# Patient Record
Sex: Male | Born: 1950 | Race: White | Hispanic: No | Smoking: Former smoker
Health system: Southern US, Community
[De-identification: ages and names within clinical notes are randomized; demographics above are authoritative.]

## PROBLEM LIST (undated history)

## (undated) DIAGNOSIS — F329 Major depressive disorder, single episode, unspecified: Secondary | ICD-10-CM

## (undated) DIAGNOSIS — I509 Heart failure, unspecified: Secondary | ICD-10-CM

## (undated) DIAGNOSIS — E213 Hyperparathyroidism, unspecified: Secondary | ICD-10-CM

## (undated) DIAGNOSIS — D649 Anemia, unspecified: Secondary | ICD-10-CM

## (undated) DIAGNOSIS — M5136 Other intervertebral disc degeneration, lumbar region: Secondary | ICD-10-CM

## (undated) DIAGNOSIS — E46 Unspecified protein-calorie malnutrition: Secondary | ICD-10-CM

## (undated) DIAGNOSIS — N200 Calculus of kidney: Secondary | ICD-10-CM

## (undated) DIAGNOSIS — F32A Depression, unspecified: Secondary | ICD-10-CM

## (undated) DIAGNOSIS — I10 Essential (primary) hypertension: Secondary | ICD-10-CM

## (undated) DIAGNOSIS — G4730 Sleep apnea, unspecified: Secondary | ICD-10-CM

## (undated) DIAGNOSIS — K759 Inflammatory liver disease, unspecified: Secondary | ICD-10-CM

## (undated) DIAGNOSIS — R609 Edema, unspecified: Secondary | ICD-10-CM

## (undated) DIAGNOSIS — E785 Hyperlipidemia, unspecified: Secondary | ICD-10-CM

## (undated) DIAGNOSIS — I639 Cerebral infarction, unspecified: Secondary | ICD-10-CM

## (undated) DIAGNOSIS — E119 Type 2 diabetes mellitus without complications: Secondary | ICD-10-CM

## (undated) DIAGNOSIS — M51369 Other intervertebral disc degeneration, lumbar region without mention of lumbar back pain or lower extremity pain: Secondary | ICD-10-CM

## (undated) HISTORY — PX: INSERTION OF DIALYSIS CATHETER: SHX1324

## (undated) HISTORY — PX: LITHOTRIPSY: SUR834

## (undated) HISTORY — PX: ROTATOR CUFF REPAIR: SHX139

## (undated) HISTORY — DX: Type 2 diabetes mellitus without complications: E11.9

## (undated) HISTORY — PX: COLONOSCOPY: SHX174

## (undated) HISTORY — DX: Hyperlipidemia, unspecified: E78.5

## (undated) HISTORY — DX: Calculus of kidney: N20.0

## (undated) HISTORY — DX: Inflammatory liver disease, unspecified: K75.9

## (undated) HISTORY — DX: Other intervertebral disc degeneration, lumbar region without mention of lumbar back pain or lower extremity pain: M51.369

## (undated) HISTORY — DX: Essential (primary) hypertension: I10

## (undated) HISTORY — DX: Other intervertebral disc degeneration, lumbar region: M51.36

## (undated) HISTORY — PX: TONSILLECTOMY: SUR1361

---

## 1998-08-12 ENCOUNTER — Encounter: Admission: RE | Admit: 1998-08-12 | Discharge: 1998-11-10 | Payer: Self-pay | Admitting: Anesthesiology

## 1998-12-29 ENCOUNTER — Encounter: Payer: Self-pay | Admitting: Neurosurgery

## 1998-12-29 ENCOUNTER — Ambulatory Visit (HOSPITAL_COMMUNITY): Admission: RE | Admit: 1998-12-29 | Discharge: 1998-12-29 | Payer: Self-pay | Admitting: Neurosurgery

## 2005-02-12 ENCOUNTER — Ambulatory Visit: Payer: Self-pay

## 2006-01-03 ENCOUNTER — Emergency Department: Payer: Self-pay | Admitting: Unknown Physician Specialty

## 2006-11-13 ENCOUNTER — Emergency Department: Payer: Self-pay | Admitting: Emergency Medicine

## 2006-12-05 ENCOUNTER — Ambulatory Visit: Payer: Self-pay | Admitting: Specialist

## 2009-03-05 ENCOUNTER — Ambulatory Visit: Payer: Self-pay | Admitting: Family Medicine

## 2009-03-11 ENCOUNTER — Encounter: Payer: Self-pay | Admitting: Cardiology

## 2009-03-18 ENCOUNTER — Ambulatory Visit: Payer: Self-pay | Admitting: Family Medicine

## 2009-03-18 ENCOUNTER — Encounter: Payer: Self-pay | Admitting: Cardiology

## 2009-04-01 ENCOUNTER — Ambulatory Visit: Payer: Self-pay

## 2009-04-03 ENCOUNTER — Encounter: Payer: Self-pay | Admitting: Cardiology

## 2009-04-03 ENCOUNTER — Ambulatory Visit: Payer: Self-pay | Admitting: Family Medicine

## 2009-04-03 ENCOUNTER — Ambulatory Visit: Payer: Self-pay | Admitting: Cardiology

## 2009-04-03 DIAGNOSIS — I309 Acute pericarditis, unspecified: Secondary | ICD-10-CM

## 2009-04-03 DIAGNOSIS — E785 Hyperlipidemia, unspecified: Secondary | ICD-10-CM

## 2009-04-03 DIAGNOSIS — R0602 Shortness of breath: Secondary | ICD-10-CM

## 2009-04-03 DIAGNOSIS — I10 Essential (primary) hypertension: Secondary | ICD-10-CM | POA: Insufficient documentation

## 2009-04-04 LAB — CONVERTED CEMR LAB: Pro B Natriuretic peptide (BNP): 255.7 pg/mL — ABNORMAL HIGH (ref 0.0–100.0)

## 2009-04-11 ENCOUNTER — Ambulatory Visit: Payer: Self-pay | Admitting: Family Medicine

## 2009-04-11 ENCOUNTER — Encounter: Payer: Self-pay | Admitting: Cardiology

## 2009-04-16 ENCOUNTER — Encounter: Payer: Self-pay | Admitting: Cardiology

## 2009-04-16 ENCOUNTER — Ambulatory Visit: Payer: Self-pay | Admitting: Family Medicine

## 2009-04-16 ENCOUNTER — Ambulatory Visit: Payer: Self-pay | Admitting: Internal Medicine

## 2009-04-17 ENCOUNTER — Ambulatory Visit: Payer: Self-pay | Admitting: Cardiology

## 2009-04-22 LAB — CONVERTED CEMR LAB: Sed Rate: 6 mm/hr (ref 0–16)

## 2009-04-24 ENCOUNTER — Ambulatory Visit: Payer: Self-pay | Admitting: Internal Medicine

## 2009-04-24 ENCOUNTER — Encounter: Payer: Self-pay | Admitting: Cardiology

## 2009-04-28 LAB — CONVERTED CEMR LAB
BUN: 18 mg/dL (ref 6–23)
CO2: 24 meq/L (ref 19–32)
Calcium: 10 mg/dL (ref 8.4–10.5)
Chloride: 100 meq/L (ref 96–112)
Creatinine, Ser: 1.04 mg/dL (ref 0.40–1.50)
Glucose, Bld: 189 mg/dL — ABNORMAL HIGH (ref 70–99)
Potassium: 3.9 meq/L (ref 3.5–5.3)
Sodium: 142 meq/L (ref 135–145)

## 2009-06-20 ENCOUNTER — Ambulatory Visit: Payer: Self-pay | Admitting: Cardiology

## 2009-07-08 ENCOUNTER — Telehealth (INDEPENDENT_AMBULATORY_CARE_PROVIDER_SITE_OTHER): Payer: Self-pay | Admitting: *Deleted

## 2010-10-18 LAB — CONVERTED CEMR LAB
BUN: 19 mg/dL (ref 6–23)
CO2: 25 meq/L (ref 19–32)
Calcium: 9.4 mg/dL (ref 8.4–10.5)
Chloride: 94 meq/L — ABNORMAL LOW (ref 96–112)
Creatinine, Ser: 1.03 mg/dL (ref 0.40–1.50)
Glucose, Bld: 177 mg/dL — ABNORMAL HIGH (ref 70–99)
Potassium: 3.2 meq/L — ABNORMAL LOW (ref 3.5–5.3)
Sodium: 140 meq/L (ref 135–145)

## 2011-03-02 ENCOUNTER — Encounter: Payer: Self-pay | Admitting: Cardiovascular Disease

## 2013-03-28 LAB — CBC
MCH: 30.2 pg (ref 26.0–34.0)
Platelet: 319 10*3/uL (ref 150–440)
RDW: 13.1 % (ref 11.5–14.5)
WBC: 11.6 10*3/uL — ABNORMAL HIGH (ref 3.8–10.6)

## 2013-03-28 LAB — COMPREHENSIVE METABOLIC PANEL
Alkaline Phosphatase: 115 U/L (ref 50–136)
Anion Gap: 6 — ABNORMAL LOW (ref 7–16)
BUN: 26 mg/dL — ABNORMAL HIGH (ref 7–18)
Bilirubin,Total: 0.3 mg/dL (ref 0.2–1.0)
Calcium, Total: 9 mg/dL (ref 8.5–10.1)
Creatinine: 1.63 mg/dL — ABNORMAL HIGH (ref 0.60–1.30)
Glucose: 234 mg/dL — ABNORMAL HIGH (ref 65–99)
Potassium: 3.2 mmol/L — ABNORMAL LOW (ref 3.5–5.1)
Sodium: 135 mmol/L — ABNORMAL LOW (ref 136–145)

## 2013-03-28 LAB — TROPONIN I: Troponin-I: <0.02 ng/mL

## 2013-03-29 ENCOUNTER — Inpatient Hospital Stay: Payer: Self-pay | Admitting: Internal Medicine

## 2013-03-29 LAB — APTT: Activated PTT: 34.4 secs (ref 23.6–35.9)

## 2013-03-29 LAB — PROTIME-INR: INR: 1

## 2013-03-29 LAB — URINALYSIS, COMPLETE
Glucose,UR: >=500 mg/dL (ref 0–75)
Specific Gravity: 1.011 (ref 1.003–1.030)

## 2013-03-30 LAB — COMPREHENSIVE METABOLIC PANEL
Anion Gap: 5 — ABNORMAL LOW (ref 7–16)
BUN: 20 mg/dL — ABNORMAL HIGH (ref 7–18)
Bilirubin,Total: 0.4 mg/dL (ref 0.2–1.0)
Calcium, Total: 8.5 mg/dL (ref 8.5–10.1)
EGFR (African American): >60
EGFR (Non-African Amer.): >60
Glucose: 119 mg/dL — ABNORMAL HIGH (ref 65–99)
Osmolality: 283 (ref 275–301)
Potassium: 3.1 mmol/L — ABNORMAL LOW (ref 3.5–5.1)
SGOT(AST): 15 U/L (ref 15–37)
Sodium: 140 mmol/L (ref 136–145)
Total Protein: 6.1 g/dL — ABNORMAL LOW (ref 6.4–8.2)

## 2013-03-30 LAB — CBC WITH DIFFERENTIAL/PLATELET
Basophil %: 0.9 %
Eosinophil #: 0.1 10*3/uL (ref 0.0–0.7)
HCT: 38.2 % — ABNORMAL LOW (ref 40.0–52.0)
Lymphocyte #: 1.7 10*3/uL (ref 1.0–3.6)
MCHC: 35.4 g/dL (ref 32.0–36.0)
Monocyte %: 9.6 %
Neutrophil #: 4.3 10*3/uL (ref 1.4–6.5)
WBC: 6.9 10*3/uL (ref 3.8–10.6)

## 2013-03-30 LAB — HEMOGLOBIN A1C: Hemoglobin A1C: 11.4 % — ABNORMAL HIGH (ref 4.2–6.3)

## 2013-03-30 LAB — LIPID PANEL
Cholesterol: 221 mg/dL — ABNORMAL HIGH (ref 0–200)
Triglycerides: 323 mg/dL — ABNORMAL HIGH (ref 0–200)
VLDL Cholesterol, Calc: 65 mg/dL — ABNORMAL HIGH (ref 5–40)

## 2013-03-30 LAB — TSH: Thyroid Stimulating Horm: 1.4 u[IU]/mL

## 2013-04-03 LAB — PLATELET COUNT: Platelet: 236 10*3/uL (ref 150–440)

## 2013-12-19 ENCOUNTER — Ambulatory Visit: Payer: Self-pay

## 2013-12-19 DIAGNOSIS — R7989 Other specified abnormal findings of blood chemistry: Secondary | ICD-10-CM

## 2014-01-01 ENCOUNTER — Ambulatory Visit: Payer: Self-pay | Admitting: Family Medicine

## 2014-01-17 ENCOUNTER — Ambulatory Visit: Payer: Self-pay | Admitting: Urology

## 2014-01-18 ENCOUNTER — Ambulatory Visit: Payer: Self-pay | Admitting: Family Medicine

## 2014-05-14 ENCOUNTER — Ambulatory Visit: Payer: Self-pay | Admitting: Family

## 2014-06-05 ENCOUNTER — Ambulatory Visit: Payer: Self-pay | Admitting: Family

## 2015-01-10 NOTE — Discharge Summary (Signed)
PATIENT NAME:  Nathan Freeman, Nathan Freeman MR#:  [id number] DATE OF BIRTH:  1951/03/13  DATE OF ADMISSION:  03/29/2013 DATE OF DISCHARGE:  03/30/2013.   Discharge/transfer to inpatient rehab at [facility].   DISCHARGE DIAGNOSES:  1.  Acute cerebral vascular accident of periventricular area on the right with left-sided weakness.  2.  Uncontrolled hypertension:  3.  Uncontrolled diabetes mellitus.  4.  Noncompliance.  5.  Hyperlipidemia.  6.  Acute renal failure over chronic kidney disease, stage III.   CONSULTS:  None.   IMAGING STUDIES DONE:  Include a CT scan of the head without contrast, which showed lacunar infarcts of the basal ganglia area.   MRI of the brain without contrast shows findings consistent with regions of subacute periventricular lacunar infarct on the right, areas of chronic lacunar infarction at the basal ganglia area, chronic small vessel white matter ischemic changes.   Ultrasound carotid Doppler showed no significant stenosis.   A 2-D echocardiogram showed left ventricular ejection fraction of 60% to 65% with impaired relaxation pattern of LV diastolic filling. No thrombus. Intact intra-atrial and intraventricular septa.   ADMITTING HISTORY AND PHYSICAL:  Please see detailed H and P dictated by Dr. Amado CoeGouru on 03/30/2013. In brief, a 64 year old patient with hypertension, diabetes, presented to the hospital complaining of left-sided weakness of past 10 days. The patient was found to have subacute infarct on CT scan, admitted to the hospitalist service.   HOSPITAL COURSE:  The patient was admitted onto a tele floor with neuro checks q.4 hours. The patient had an MRI of the brain which showed subacute stroke on the right side. The carotid ultrasound and echo showed no significant findings. The patient was taking baby aspirin to which Plavix has been added. He has been noncompliant with his hypertension, diabetes and statin medication. He has been started on atorvastatin along with the  blood pressure medications and diabetes medications. The patient has had symptoms for 10 days. His stroke could have happen in the past few days. Blood pressure is slowly being controlled.   The patient was seen by Physical Therapy who recommended inpatient rehab and will be transferred when a bed is available.   On the day of discharge, the patient's motor strength is 5/5 in upper extremities and 4/5 in the left lower extremity.   DISCHARGE MEDICATIONS:  Include  1.  Metformin 1000 mg oral 2 times a day.  2.  Citalopram 40 mg oral once a day.  3.  Fish oil 1200 mg oral once a day.  4.  Lisinopril 20 mg oral once a day. 5.  Glipizide 5 mg oral 2 times a day.  6.  Atorvastatin 20 mg oral once a day.  7.  Aspirin 81 mg oral once a day.  8.  Plavix 75 mg oral once a day.  9.  Coreg 25 mg oral 2 times a day.  10.  Amlodipine 10 mg oral once a day to be started on 04/01/2013 if systolic blood pressure greater than 160.   DISCHARGE INSTRUCTIONS:  A low-sodium, low-fat diet, carbohydrate-controlled. Activity as tolerated with assistance. Follow up in 1 to 2 days after transfer with the rehab physician.   TIME SPENT ON DAY OF DISCHARGE WAS:  45 minutes.  ____________________________ Molinda BailiffSrikar R. Addelynn Batte, MD srs:jm D: 03/30/2013 15:33:42 ET T: 03/30/2013 15:52:51 ET JOB#: [id number]  cc: Wardell HeathSrikar R. Virgel Haro, MD, <Dictator> Orie FishermanSRIKAR R Latina Frank MD ELECTRONICALLY SIGNED 04/08/2013 23:40

## 2015-01-10 NOTE — Discharge Summary (Signed)
PATIENT NAME:  Nathan HartshornWEBSTER, [name] MR#:  [id number] DATE OF BIRTH:  [birth year]  DATE OF ADMISSION:  03/29/2013 DATE OF DISCHARGE:    ADDENDUM  Original interim discharge dictation done by Dr. Elpidio AnisSudini. Original discharge dictation remains the same except:   DISCHARGE MEDICATIONS: Include Celexa 40 mg p.o. daily, fish oil 1200 mg p.o. daily, lisinopril 20 mg p.o. daily, Lipitor 20 mg p.o. daily, aspirin 81 mg daily, Plavix 75 mg p.o. daily, Coreg 25 mg p.o. b.i.d., amlodipine 10 mg p.o. daily, glipizide 10 mg p.o. b.i.d., hydralazine 25 mg p.o. b.i.d., metformin 1 gram p.o. b.i.d.   DIET: Low-sodium, low-fat, ADA diet.   The patient's glipizide has been increased and hydrallazine is  added.    The patient was seen by physical therapy and he was able to walk 350 feet with front-wheeled walker, so he did not qualify for inpatient rehab. Case manager is trying to get home physical therapy, so we are sending him home with home health and Britta MccreedyBarbara from [facility] is going to look at the patient for financial issues, and only if he gets home physical therapy I am going to let him go. Otherwise, he needs to stay for further physical therapy.   TIME SPENT: More than 30 minutes.   ____________________________ Katha HammingSnehalatha Mandela Bello, MD sk:jm D: 04/02/2013 13:58:00 ET T: 04/02/2013 14:29:05 ET JOB#: [id number]  cc: Katha HammingSnehalatha Karrington Studnicka, MD, <Dictator> Katha HammingSNEHALATHA Kensington Rios MD ELECTRONICALLY SIGNED 04/11/2013 22:25

## 2015-01-10 NOTE — Discharge Summary (Signed)
PATIENT NAME:  Nathan HartshornWEBSTER, Nathan Freeman#:  [id number] DATE OF BIRTH:  1950-11-28  DATE OF ADMISSION:  03/29/2013 DATE OF DISCHARGE:    ADDENDUM    For original discharge dictation done by Dr. Elpidio AnisSudini, I did an addendum yesterday, but the patient did not go to home because of not getting physical therapy at home. Today, the case manager is able to get the physical therapy with [facility] to home, and the patient will get home physical therapy and nursing for disease management and diabetes, so he will be going home today on discharge medications as I listed yesterday. He will get physical therapy and has [facility] appointment on July 22 at 6:30 p.m.   ____________________________ Katha HammingSnehalatha Gleen Ripberger, MD sk:jm D: 04/03/2013 13:18:33 ET T: 04/03/2013 13:40:27 ET JOB#: [id number]  cc: Katha HammingSnehalatha Yeny Schmoll, MD, <Dictator> Katha HammingSNEHALATHA Sylvestre Rathgeber MD ELECTRONICALLY SIGNED 04/08/2013 14:14

## 2015-01-10 NOTE — H&P (Signed)
PATIENT NAME:  Nathan Freeman, Nathan Freeman MR#:  [id number] DATE OF BIRTH:  [birth year]  DATE OF ADMISSION:  03/29/2013  REFERRING PHYSICIAN: Andrey FarmerJulie E. Lavella LemonsManly, MD  PRIMARY CARE PHYSICIAN: Nonlocal.   CHIEF COMPLAINT: Left-sided weakness for the past 10 days.   HISTORY OF PRESENT ILLNESS: The patient is a 64 year old pleasant male with a past medical history of diabetes mellitus, hypertension, hyperlipidemia, chronic low back pain, sciatica and osteoarthritis. He is presenting to the ER with progressive worsening of left-sided weakness for the past 10 days. The patient is reporting that he went on a mission trip for church to [place], and while coming back, he drove for approximately 45 hours. He came home on June 29th and fell asleep because he was tired. After he woke up, he started feeling weak on the left lower extremity. Eventually, the left leg gave out, and he fell on 3 separate occasions for 3 times. The first time when he fell, he hit his left side of his forehead and broke his glasses, but he denies any major injuries, but progressively, the left lower extremity weakness extended to the left upper extremity, and he was having difficulty with ambulation. He denies any forgetfulness or speech difficulties. Denies any difficulty in swallowing. As the patient's wife forced him to go to ER, he came into the ER, and a CAT scan of the head has revealed right lacunar infarct. Hospitalist team is called to admit the patient. The patient has chronic history of diabetes mellitus and thinks, because of that, he has chronic kidney problems. He has chronic history of headache, and it is not getting worse. Denies any blurry vision or floaters. Denies any forgetfulness. No other complaints.   PAST MEDICAL HISTORY:  1. Diabetes mellitus.  2. Hypertension.  3. Hyperlipidemia.  4. Osteoarthritis.  5. Chronic low back pain, sciatica.   PAST SURGICAL HISTORY: Left shoulder repair.  ALLERGIES: No known drug allergies.    HOME MEDICATIONS:  1. Vytorin 10/20 one tablet p.o. once daily. 2. Prevacid 40 mg 2 tablets once a day.  3.  Metoprolol 25 mg 2 times a day.  5. Metformin 1000 mg 2 times a day.  6. Lisinopril 10 mg once daily. This needs to be clarified as it says 20 mg as well. 7. Ibuprofen 900 mg 1 tablet 3 times a day.  8. Hydrochlorothiazide 1 tablet once daily. 9. Glipizide 1 tablet 2 times a day. 10. Fish oil 1200 mg once daily.  11. Celexa 1 tablet p.o. once daily. 12. Coreg 25 mg 2 times a day. 13. Benicar 40 mg 1 tablet once a day.  14. Amlodipine 10 mg once daily.  15. Aspirin 81 mg once daily.   PSYCHOSOCIAL HISTORY: Lives at home with wife. Denies any history of smoking, alcohol or illicit drug usage.   FAMILY HISTORY: Both parents had heart attack. Multiple brothers have heart attack and 1 brother had coronary artery bypass grafting.   REVIEW OF SYSTEMS:  CONSTITUTIONAL: Denies any fever, fatigue, weakness.  EYES: Denies any blurry vision. No glaucoma.  ENT: Denies epistaxis, snoring, postnasal drip.  RESPIRATION: Denies any cough, COPD.  CARDIOVASCULAR: No chest pain, palpitations, syncope.  GASTROINTESTINAL: Denies nausea, vomiting, diarrhea.  GENITOURINARY: No dysuria or hematuria. No hernia or prostatitis.  ENDOCRINE: Denies any polyuria, nocturia, thyroid problems.  HEMATOLOGIC AND LYMPHATIC: Denies any anemia, easy bruising or bleeding.  INTEGUMENTARY: No acne, rash, lesions.  MUSCULOSKELETAL: No joint effusion, tenderness or erythema, but has chronic low back pain.  NEUROLOGIC: Complaining  of 10-day history of left-sided weakness. Denies any dysarthria or epilepsy.  PSYCHIATRIC: Denies any ADD, OCD, bipolar disorder.   PHYSICAL EXAMINATION:  VITAL SIGNS: Temperature 98.9, pulse 57, respirations 20, blood pressure is 176/84, pulse oximetry is 97% on room air. GENERAL APPEARANCE: Not under acute distress. Moderately built and moderately nourished.  HEENT: Normocephalic,  atraumatic. Pupils are equally reacting to light and accommodation. No scleral icterus. No conjunctival injection. No sinus tenderness. No postnasal drip. No pharyngeal exudates.  NECK: Supple. No JVD. No thyromegaly. No lymphadenopathy. Range of motion is intact.  LUNGS: Clear to auscultation bilaterally. No accessory muscle usage. No anterior chest wall tenderness on palpation.  CARDIAC: S1, S2 normal. Regular rate and rhythm. No murmurs.  GASTROINTESTINAL: Soft. Bowel sounds are positive in all 4 quadrants. Nontender, nondistended. No masses felt. No hepatosplenomegaly. NEUROLOGIC: Awake, alert and oriented x3. Cranial nerves II through XII are grossly intact. No facial droop. No cerebellar signs. Sensory is intact. Motor: Right upper extremity and lower extremity are intact. Left upper and lower extremity weak, with strength of 3 out of 5. Reflexes are 2+. No Babinski sign.  EXTREMITIES: No edema. No cyanosis. No clubbing.  PSYCHIATRIC: Normal mood and affect.  MUSCULOSKELETAL: No joint effusion, tenderness or erythema.  SKIN: Normal turgor. No rashes. No lesions.   LABORATORY AND IMAGING STUDIES: CAT scan of the head has revealed lacunar infarct in the periventricular white matter in the right parietal lobe. No hemorrhage or cortical infarct. The remainder of the findings are described in the body of the report. A 12-lead EKG: Sinus rhythm with first-degree AV block, nonspecific ST-T wave changes. Glucose 234, BUN 26, creatinine 1.63, sodium 137, potassium 3.2, chloride 100, CO2 29, GFR 45, serum osmolality 82, calcium 9.0. LFTs are within normal range except albumin which is low at 3.2. Troponin less than 0.02. WBC 11.6, hemoglobin 14.8, hematocrit 43.2, platelets 319. PT 13.4, INR 1.4.   ASSESSMENT AND PLAN:  1. Right-sided lacunar stroke with progressive left-sided weakness. Plan: Will admit to telemetry. Will get cerebrovascular accident workup with carotid Dopplers, 2-D echocardiogram and MRI  of the brain. Will give him aspirin, statin, and put a consult to physical therapy regarding left-sided weakness. Neuro checks.  2. Renal insufficiency, probably acute on chronic. Will provide him IV fluids and holding Benicar for now. Repeat BMP and monitor renal function closely. If necessary, will hold off on the metformin as well.  3. History of diabetes mellitus. Continue his home medication, metformin and glipizide.  4. Hypertension. Blood pressure is elevated. Will allow permissive hypertension in view of right lacunar stroke. Resume his home medications and titrate on as-needed basis.  5. Hyperlipidemia. Continue statin.   CODE STATUS: The patient is full code. Wife is his medical POA.   Total time spent on the admission, including discussion with the patient, physical examination, reviewing old medical records and discussion with the ER staff, is 50 minutes.  The plan of care was discussed with the patient and his daughter at bedside. They both verbalized understanding of the plan.   ____________________________ Ramonita Lab, MD ag:OSi D: 03/29/2013 04:55:38 ET T: 03/29/2013 06:06:31 ET JOB#: [id number]  cc: Ramonita Lab, MD, <Dictator> Ramonita Lab MD ELECTRONICALLY SIGNED 04/04/2013 0:47

## 2015-11-25 ENCOUNTER — Other Ambulatory Visit: Payer: Self-pay | Admitting: Vascular Surgery

## 2015-12-03 ENCOUNTER — Encounter
Admission: RE | Admit: 2015-12-03 | Discharge: 2015-12-03 | Disposition: A | Discharge status: Home / Self Care | Payer: Medicare (Managed Care) | Source: Ambulatory Visit | Attending: Vascular Surgery | Admitting: Vascular Surgery

## 2015-12-03 DIAGNOSIS — Z01810 Encounter for preprocedural cardiovascular examination: Secondary | ICD-10-CM | POA: Diagnosis present

## 2015-12-03 DIAGNOSIS — Z01812 Encounter for preprocedural laboratory examination: Secondary | ICD-10-CM | POA: Diagnosis present

## 2015-12-03 DIAGNOSIS — I10 Essential (primary) hypertension: Secondary | ICD-10-CM

## 2015-12-03 HISTORY — DX: Heart failure, unspecified: I50.9

## 2015-12-03 HISTORY — DX: Hyperparathyroidism, unspecified: E21.3

## 2015-12-03 HISTORY — DX: Anemia, unspecified: D64.9

## 2015-12-03 HISTORY — DX: Sleep apnea, unspecified: G47.30

## 2015-12-03 HISTORY — DX: Unspecified protein-calorie malnutrition: E46

## 2015-12-03 HISTORY — DX: Major depressive disorder, single episode, unspecified: F32.9

## 2015-12-03 HISTORY — DX: Cerebral infarction, unspecified: I63.9

## 2015-12-03 HISTORY — DX: Edema, unspecified: R60.9

## 2015-12-03 HISTORY — DX: Depression, unspecified: F32.A

## 2015-12-03 LAB — BASIC METABOLIC PANEL
ANION GAP: 9 (ref 5–15)
BUN: 28 mg/dL — ABNORMAL HIGH (ref 6–20)
CALCIUM: 9.3 mg/dL (ref 8.9–10.3)
CO2: 27 mmol/L (ref 22–32)
Chloride: 98 mmol/L — ABNORMAL LOW (ref 101–111)
Creatinine, Ser: 3.46 mg/dL — ABNORMAL HIGH (ref 0.61–1.24)
GFR, EST AFRICAN AMERICAN: 20 mL/min — AB (ref >60)
GFR, EST NON AFRICAN AMERICAN: 17 mL/min — AB (ref >60)
Glucose, Bld: 175 mg/dL — ABNORMAL HIGH (ref 65–99)
Potassium: 3.3 mmol/L — ABNORMAL LOW (ref 3.5–5.1)
SODIUM: 134 mmol/L — AB (ref 135–145)

## 2015-12-03 LAB — CBC WITH DIFFERENTIAL/PLATELET
BASOS ABS: 0.1 10*3/uL (ref 0–0.1)
BASOS PCT: 1 %
Eosinophils Absolute: 0.2 10*3/uL (ref 0–0.7)
Eosinophils Relative: 2 %
HEMATOCRIT: 26.6 % — AB (ref 40.0–52.0)
HEMOGLOBIN: 8.9 g/dL — AB (ref 13.0–18.0)
Lymphocytes Relative: 4 %
Lymphs Abs: 0.4 10*3/uL — ABNORMAL LOW (ref 1.0–3.6)
MCH: 30.4 pg (ref 26.0–34.0)
MCHC: 33.6 g/dL (ref 32.0–36.0)
MCV: 90.6 fL (ref 80.0–100.0)
MONO ABS: 1 10*3/uL (ref 0.2–1.0)
Monocytes Relative: 11 %
NEUTROS ABS: 7.4 10*3/uL — AB (ref 1.4–6.5)
NEUTROS PCT: 82 %
Platelets: 244 10*3/uL (ref 150–440)
RBC: 2.93 MIL/uL — AB (ref 4.40–5.90)
RDW: 14.6 % — AB (ref 11.5–14.5)
WBC: 9.1 10*3/uL (ref 3.8–10.6)

## 2015-12-03 LAB — APTT: aPTT: 43 seconds — ABNORMAL HIGH (ref 24–36)

## 2015-12-03 LAB — ABO/RH: ABO/RH(D): O POS

## 2015-12-03 LAB — TYPE AND SCREEN
ABO/RH(D): O POS
ANTIBODY SCREEN: NEGATIVE

## 2015-12-03 LAB — PROTIME-INR
INR: 1.23
Prothrombin Time: 15.7 seconds — ABNORMAL HIGH (ref 11.4–15.0)

## 2015-12-03 NOTE — Patient Instructions (Signed)
  Your procedure is scheduled on: 12/11/15 Report to Day Surgery. [facility]OND [facility]LOOR To find out your arrival time please call [phone] between 1PM - 3PM on 12/10/15  Remember: Instructions that are not followed completely may result in serious medical risk, up to and including death, or upon the discretion of your surgeon and anesthesiologist your surgery may need to be rescheduled.    _X___ 1. Do not eat food or drink liquids after midnight. No gum chewing or hard candies.     __X__ 2. No Alcohol for 24 hours before or after surgery.   ____ 3. Bring all medications with you on the day of surgery if instructed.    _X___ 4. Notify your doctor if there is any change in your medical condition     (cold, fever, infections).     Do not wear jewelry, make-up, hairpins, clips or nail polish.  Do not wear lotions, powders, or perfumes. You may wear deodorant.  Do not shave 48 hours prior to surgery. Men may shave face and neck.  Do not bring valuables to the hospital.    [facility] is not responsible for any belongings or valuables.               Contacts, dentures or bridgework may not be worn into surgery.  Leave your suitcase in the car. After surgery it may be brought to your room.  For patients admitted to the hospital, discharge time is determined by your                treatment team.   Patients discharged the day of surgery will not be allowed to drive home.   Please read over the following fact sheets that you were given:   Surgical Site Infection Prevention   ____ Take these medicines the morning of surgery with A SIP OF WATER:    1. LISINOPRIL  2. AMLODIPINE  3. CITALOPRAM  4. HYDRALAZINE  5.PROTONIX AM OF SURGERY AND AT BEDTIME NIGHT BEFORE SURGERY  6.CARVEDILOL  ____ Fleet Enema (as directed)   __X__ UseSAGE WIPES as directed  __X__ Use inhalers on the day of surgery  ____ Stop metformin 2 days prior to surgery    _X___ Take 1/2 of usual insulin dose  the night before surgery and none on the morning of surgery.   _X ___ Stop Coumadin/Plavix/aspirin on    CONTINUE ASPIRIN AND PLAVIX BUT DO NOT TAKE AM SURGERY  ____ Stop Anti-inflammatories on  ____ Stop supplements until after surgery.    _X___ Bring C-Pap to the hospital.

## 2015-12-03 NOTE — Pre-Procedure Instructions (Signed)
EKG CALLED TO DR J ADAMS AND OK TO PROCEED 

## 2015-12-11 ENCOUNTER — Ambulatory Visit
Admission: RE | Admit: 2015-12-11 | Discharge: 2015-12-11 | Disposition: A | Discharge status: Home / Self Care | Payer: Medicare (Managed Care) | Source: Ambulatory Visit | Attending: Vascular Surgery | Admitting: Vascular Surgery

## 2015-12-11 ENCOUNTER — Encounter: Payer: Self-pay | Admitting: *Deleted

## 2015-12-11 ENCOUNTER — Ambulatory Visit: Payer: Medicare (Managed Care) | Admitting: Anesthesiology

## 2015-12-11 ENCOUNTER — Encounter
Admission: RE | Disposition: A | Discharge status: Home / Self Care | Payer: Self-pay | Source: Ambulatory Visit | Attending: Vascular Surgery

## 2015-12-11 DIAGNOSIS — Z7902 Long term (current) use of antithrombotics/antiplatelets: Secondary | ICD-10-CM | POA: Insufficient documentation

## 2015-12-11 DIAGNOSIS — Z9114 Patient's other noncompliance with medication regimen: Secondary | ICD-10-CM | POA: Insufficient documentation

## 2015-12-11 DIAGNOSIS — R05 Cough: Secondary | ICD-10-CM | POA: Diagnosis not present

## 2015-12-11 DIAGNOSIS — I120 Hypertensive chronic kidney disease with stage 5 chronic kidney disease or end stage renal disease: Secondary | ICD-10-CM | POA: Insufficient documentation

## 2015-12-11 DIAGNOSIS — Z87891 Personal history of nicotine dependence: Secondary | ICD-10-CM | POA: Diagnosis not present

## 2015-12-11 DIAGNOSIS — N186 End stage renal disease: Secondary | ICD-10-CM | POA: Insufficient documentation

## 2015-12-11 DIAGNOSIS — M5136 Other intervertebral disc degeneration, lumbar region: Secondary | ICD-10-CM | POA: Diagnosis not present

## 2015-12-11 DIAGNOSIS — Z793 Long term (current) use of hormonal contraceptives: Secondary | ICD-10-CM | POA: Insufficient documentation

## 2015-12-11 DIAGNOSIS — I69354 Hemiplegia and hemiparesis following cerebral infarction affecting left non-dominant side: Secondary | ICD-10-CM | POA: Diagnosis not present

## 2015-12-11 DIAGNOSIS — R0602 Shortness of breath: Secondary | ICD-10-CM | POA: Diagnosis not present

## 2015-12-11 DIAGNOSIS — E785 Hyperlipidemia, unspecified: Secondary | ICD-10-CM | POA: Insufficient documentation

## 2015-12-11 DIAGNOSIS — Z79899 Other long term (current) drug therapy: Secondary | ICD-10-CM | POA: Diagnosis not present

## 2015-12-11 DIAGNOSIS — Z794 Long term (current) use of insulin: Secondary | ICD-10-CM | POA: Insufficient documentation

## 2015-12-11 DIAGNOSIS — E1122 Type 2 diabetes mellitus with diabetic chronic kidney disease: Secondary | ICD-10-CM | POA: Diagnosis present

## 2015-12-11 HISTORY — PX: AV FISTULA PLACEMENT: SHX1204

## 2015-12-11 LAB — POCT I-STAT 4, (NA,K, GLUC, HGB,HCT)
Glucose, Bld: 87 mg/dL (ref 65–99)
HEMATOCRIT: 28 % — AB (ref 39.0–52.0)
Hemoglobin: 9.5 g/dL — ABNORMAL LOW (ref 13.0–17.0)
Potassium: 3.1 mmol/L — ABNORMAL LOW (ref 3.5–5.1)
SODIUM: 139 mmol/L (ref 135–145)

## 2015-12-11 LAB — GLUCOSE, CAPILLARY
GLUCOSE-CAPILLARY: 97 mg/dL (ref 65–99)
Glucose-Capillary: 72 mg/dL (ref 65–99)

## 2015-12-11 SURGERY — ARTERIOVENOUS (AV) FISTULA CREATION
Anesthesia: General | Laterality: Left

## 2015-12-11 MED ORDER — ONDANSETRON HCL 4 MG/2ML IJ SOLN: 4.0000 mg | Freq: Once | INTRAMUSCULAR | Status: DC | PRN

## 2015-12-11 MED ORDER — DEXTROSE 50 % IV SOLN
25.0000 mL | Freq: Once | INTRAVENOUS | Status: AC
  Administered 2015-12-11: 25 mL via INTRAVENOUS

## 2015-12-11 MED ORDER — HYDROCODONE-ACETAMINOPHEN 5-325 MG PO TABS: 1.0000 | ORAL_TABLET | Freq: Four times a day (QID) | ORAL | Status: DC | PRN

## 2015-12-11 MED ORDER — SODIUM CHLORIDE 0.9 % IV SOLN
INTRAVENOUS | Status: DC | PRN
  Administered 2015-12-11: 20 mL via INTRAMUSCULAR

## 2015-12-11 MED ORDER — DEXTROSE 50 % IV SOLN
INTRAVENOUS | Status: AC
  Administered 2015-12-11: 25 mL via INTRAVENOUS
  Filled 2015-12-11: qty 50

## 2015-12-11 MED ORDER — LIDOCAINE HCL (CARDIAC) 20 MG/ML IV SOLN
INTRAVENOUS | Status: DC | PRN
  Administered 2015-12-11: 60 mg via INTRAVENOUS

## 2015-12-11 MED ORDER — HYDROCODONE-ACETAMINOPHEN 5-325 MG PO TABS
1.0000 | ORAL_TABLET | Freq: Four times a day (QID) | ORAL | Status: DC | PRN
  Administered 2015-12-11: 1 via ORAL

## 2015-12-11 MED ORDER — EPHEDRINE SULFATE 50 MG/ML IJ SOLN
INTRAMUSCULAR | Status: DC | PRN
  Administered 2015-12-11 (×2): 10 mg via INTRAVENOUS

## 2015-12-11 MED ORDER — SODIUM CHLORIDE 0.9 % IV SOLN
INTRAVENOUS | Status: DC
  Administered 2015-12-11: 15:00:00 via INTRAVENOUS

## 2015-12-11 MED ORDER — SODIUM CHLORIDE 0.9 % IV SOLN
INTRAVENOUS | Status: DC | PRN
  Administered 2015-12-11 (×2): via INTRAVENOUS

## 2015-12-11 MED ORDER — HEPARIN SODIUM (PORCINE) 5000 UNIT/ML IJ SOLN
INTRAMUSCULAR | Status: AC
  Filled 2015-12-11: qty 1

## 2015-12-11 MED ORDER — MIDAZOLAM HCL 5 MG/5ML IJ SOLN
INTRAMUSCULAR | Status: DC | PRN
  Administered 2015-12-11: 1 mg via INTRAVENOUS

## 2015-12-11 MED ORDER — DEXTROSE 5 % IV SOLN
2.0000 g | INTRAVENOUS | Status: DC
  Filled 2015-12-11: qty 20

## 2015-12-11 MED ORDER — BUPIVACAINE-EPINEPHRINE (PF) 0.5% -1:200000 IJ SOLN
INTRAMUSCULAR | Status: AC
  Filled 2015-12-11: qty 30

## 2015-12-11 MED ORDER — FENTANYL CITRATE (PF) 100 MCG/2ML IJ SOLN
INTRAMUSCULAR | Status: DC | PRN
  Administered 2015-12-11: 25 ug via INTRAVENOUS
  Administered 2015-12-11: 50 ug via INTRAVENOUS
  Administered 2015-12-11: 25 ug via INTRAVENOUS

## 2015-12-11 MED ORDER — IPRATROPIUM-ALBUTEROL 0.5-2.5 (3) MG/3ML IN SOLN
3.0000 mL | Freq: Once | RESPIRATORY_TRACT | Status: AC
  Administered 2015-12-11: 3 mL via RESPIRATORY_TRACT

## 2015-12-11 MED ORDER — HYDROCODONE-ACETAMINOPHEN 5-325 MG PO TABS
ORAL_TABLET | ORAL | Status: AC
  Filled 2015-12-11: qty 1

## 2015-12-11 MED ORDER — FENTANYL CITRATE (PF) 100 MCG/2ML IJ SOLN: 25.0000 ug | INTRAMUSCULAR | Status: DC | PRN

## 2015-12-11 MED ORDER — CEFAZOLIN SODIUM-DEXTROSE 2-3 GM-% IV SOLR
INTRAVENOUS | Status: AC
  Filled 2015-12-11: qty 50

## 2015-12-11 MED ORDER — PROPOFOL 10 MG/ML IV BOLUS
INTRAVENOUS | Status: DC | PRN
  Administered 2015-12-11: 30 mg via INTRAVENOUS
  Administered 2015-12-11: 100 mg via INTRAVENOUS
  Administered 2015-12-11: 30 mg via INTRAVENOUS

## 2015-12-11 MED ORDER — PAPAVERINE HCL 30 MG/ML IJ SOLN
INTRAMUSCULAR | Status: AC
  Filled 2015-12-11: qty 2

## 2015-12-11 MED ORDER — HEPARIN SODIUM (PORCINE) 1000 UNIT/ML IJ SOLN
INTRAMUSCULAR | Status: DC | PRN
  Administered 2015-12-11: 3000 [IU] via INTRAVENOUS

## 2015-12-11 MED ORDER — GLYCOPYRROLATE 0.2 MG/ML IJ SOLN
INTRAMUSCULAR | Status: DC | PRN
  Administered 2015-12-11: 0.2 mg via INTRAVENOUS

## 2015-12-11 MED ORDER — IPRATROPIUM-ALBUTEROL 0.5-2.5 (3) MG/3ML IN SOLN
RESPIRATORY_TRACT | Status: AC
  Administered 2015-12-11: 3 mL via RESPIRATORY_TRACT
  Filled 2015-12-11: qty 3

## 2015-12-11 SURGICAL SUPPLY — 49 items
BAG DECANTER FOR FLEXI CONT (MISCELLANEOUS) ×3 IMPLANT
BLADE SURG SZ11 CARB STEEL (BLADE) ×3 IMPLANT
BOOT SUTURE AID YELLOW STND (SUTURE) ×3 IMPLANT
BRUSH SCRUB 4% CHG (MISCELLANEOUS) ×3 IMPLANT
CANISTER SUCT 1200ML W/VALVE (MISCELLANEOUS) ×3 IMPLANT
CHLORAPREP W/TINT 26ML (MISCELLANEOUS) ×6 IMPLANT
CLIP SPRNG 6MM S-JAW DBL (CLIP) ×3
ELECT CAUTERY BLADE 6.4 (BLADE) ×3 IMPLANT
ELECT REM PT RETURN 9FT ADLT (ELECTROSURGICAL) ×3
ELECTRODE REM PT RTRN 9FT ADLT (ELECTROSURGICAL) ×1 IMPLANT
GEL ULTRASOUND 20GR AQUASONIC (MISCELLANEOUS) IMPLANT
GLOVE BIO SURGEON STRL SZ7 (GLOVE) ×9 IMPLANT
GLOVE INDICATOR 7.5 STRL GRN (GLOVE) ×9 IMPLANT
GOWN STRL REUS W/ TWL LRG LVL3 (GOWN DISPOSABLE) ×2 IMPLANT
GOWN STRL REUS W/ TWL XL LVL3 (GOWN DISPOSABLE) ×1 IMPLANT
GOWN STRL REUS W/TWL LRG LVL3 (GOWN DISPOSABLE) ×4
GOWN STRL REUS W/TWL XL LVL3 (GOWN DISPOSABLE) ×2
HEMOSTAT SURGICEL 2X3 (HEMOSTASIS) ×3 IMPLANT
IV NS 500ML (IV SOLUTION) ×2
IV NS 500ML BAXH (IV SOLUTION) ×1 IMPLANT
KIT RM TURNOVER STRD PROC AR (KITS) ×3 IMPLANT
LABEL OR SOLS (LABEL) IMPLANT
LIQUID BAND (GAUZE/BANDAGES/DRESSINGS) ×3 IMPLANT
LOOP RED MAXI  1X406MM (MISCELLANEOUS) ×2
LOOP VESSEL MAXI 1X406 RED (MISCELLANEOUS) ×1 IMPLANT
LOOP VESSEL MINI 0.8X406 BLUE (MISCELLANEOUS) ×1 IMPLANT
LOOPS BLUE MINI 0.8X406MM (MISCELLANEOUS) ×2
NEEDLE FILTER BLUNT 18X 1/2SAF (NEEDLE) ×2
NEEDLE FILTER BLUNT 18X1 1/2 (NEEDLE) ×1 IMPLANT
NEEDLE HYPO 30X.5 LL (NEEDLE) IMPLANT
NS IRRIG 500ML POUR BTL (IV SOLUTION) ×3 IMPLANT
PACK EXTREMITY ARMC (MISCELLANEOUS) ×3 IMPLANT
PAD PREP 24X41 OB/GYN DISP (PERSONAL CARE ITEMS) ×3 IMPLANT
SOLUTION CELL SAVER (CLIP) ×1 IMPLANT
STOCKINETTE STRL 4IN 9604848 (GAUZE/BANDAGES/DRESSINGS) ×3 IMPLANT
SUT MNCRL AB 4-0 PS2 18 (SUTURE) ×3 IMPLANT
SUT PROLENE 6 0 BV (SUTURE) ×12 IMPLANT
SUT SILK 2 0 (SUTURE) ×2
SUT SILK 2-0 18XBRD TIE 12 (SUTURE) ×1 IMPLANT
SUT SILK 3 0 (SUTURE) ×2
SUT SILK 3-0 18XBRD TIE 12 (SUTURE) ×1 IMPLANT
SUT SILK 4 0 (SUTURE) ×2
SUT SILK 4-0 18XBRD TIE 12 (SUTURE) ×1 IMPLANT
SUT VIC AB 3-0 SH 27 (SUTURE) ×4
SUT VIC AB 3-0 SH 27X BRD (SUTURE) ×2 IMPLANT
SYR 20CC LL (SYRINGE) ×3 IMPLANT
SYR 3ML LL SCALE MARK (SYRINGE) ×3 IMPLANT
SYR TB 1ML 27GX1/2 LL (SYRINGE) IMPLANT
TOWEL OR 17X26 4PK STRL BLUE (TOWEL DISPOSABLE) IMPLANT

## 2015-12-11 NOTE — H&P (Signed)
  [facility] History & Physical Update  The patient was interviewed and re-examined.  The patient's previous History and Physical has been reviewed and is unchanged.  There is no change in the plan of care. We plan to proceed with the scheduled procedure.  DEW,JASON, MD  12/11/2015, 1:40 PM

## 2015-12-11 NOTE — Anesthesia Postprocedure Evaluation (Signed)
Anesthesia Post Note  Patient: Nathan HartshornGerry W Schwake  Procedure(s) Performed: Procedure(s) (LRB): ARTERIOVENOUS (AV) FISTULA CREATION ,BRACHIOCEPHALIC (Left)  Patient location during evaluation: PACU Anesthesia Type: General Level of consciousness: awake and alert and oriented Pain management: pain level controlled Vital Signs Assessment: post-procedure vital signs reviewed and stable Respiratory status: spontaneous breathing Cardiovascular status: blood pressure returned to baseline Anesthetic complications: no    Last Vitals:  Filed Vitals:   12/11/15 1730 12/11/15 1827  BP: 139/79 133/72  Pulse: 76 80  Temp: 36.6 C   Resp: 20 20    Last Pain:  Filed Vitals:   12/11/15 1828  PainSc: 0-No pain                 Nastassia Bazaldua

## 2015-12-11 NOTE — Anesthesia Preprocedure Evaluation (Signed)
Anesthesia Evaluation  Patient identified by MRN, date of birth, ID band Patient awake    Reviewed: Allergy & Precautions, H&P , NPO status , Patient's Chart, lab work & pertinent test results, reviewed documented beta blocker date and time   History of Anesthesia Complications Negative for: history of anesthetic complications  Airway Mallampati: I  TM Distance: >3 FB Neck ROM: full    Dental no notable dental hx. (+) Chipped, Poor Dentition Permanent bridge on the top front:   Pulmonary shortness of breath and with exertion, sleep apnea and Continuous Positive Airway Pressure Ventilation , neg COPD, Recent URI , Residual Cough, former smoker,    Pulmonary exam normal breath sounds clear to auscultation       Cardiovascular Exercise Tolerance: Good hypertension, (-) angina+CHF  (-) CAD, (-) Past MI, (-) Cardiac Stents and (-) CABG negative cardio ROS Normal cardiovascular exam(-) dysrhythmias (-) Valvular Problems/Murmurs Rhythm:regular Rate:Normal     Neuro/Psych neg Seizures PSYCHIATRIC DISORDERS (Depression) CVA (left sided weakness), Residual Symptoms    GI/Hepatic GERD  Medicated,(+) Hepatitis -  Endo/Other  diabetes  Renal/GU ESRF and DialysisRenal disease  negative genitourinary   Musculoskeletal   Abdominal   Peds  Hematology  (+) Blood dyscrasia, anemia ,   Anesthesia Other Findings Past Medical History:   DDD (degenerative disc disease), lumbar                        Comment:Spine   Hepatitis, unspecified                                       Hyperlipidemia                                               Diabetes mellitus type II                                      Comment:Poor control due to medication noncompliance.               Last hgbA1c 13.2   Hypertension                                                   Comment:Cough with a BP med in teh past, sounds like               ACEI   Acute pericarditis                                              Comment:Probable, Echo (7/10) showed mild LVH, normal               LV size and systolic function, EF 55-60%,               normal wall motion, mild MR, no AS, partially               loculated small effusion along  the RA free wall   Stroke [facility])                                                   Comment:02/2013 left side weakness   Sleep apnea                                                    Comment:cpap   CHF (congestive heart failure) (HCC)                         Depression                                                   Kidney stones                                                  Comment:dialysis t,t,s   Anemia                                                       Hyperparathyroidism (HCC)                                    Edema                                                        Malnutrition (HCC)                                           Reproductive/Obstetrics negative OB ROS                             Anesthesia Physical Anesthesia Plan  ASA: IV  Anesthesia Plan: General   Post-op Pain Management:    Induction:   Airway Management Planned:   Additional Equipment:   Intra-op Plan:   Post-operative Plan:   Informed Consent: I have reviewed the patients History and Physical, chart, labs and discussed the procedure including the risks, benefits and alternatives for the proposed anesthesia with the patient or authorized representative who has indicated his/her understanding and acceptance.   Dental Advisory Given  Plan Discussed with: Anesthesiologist, CRNA and Surgeon  Anesthesia Plan Comments:         Anesthesia Quick Evaluation

## 2015-12-11 NOTE — Transfer of Care (Signed)
Immediate Anesthesia Transfer of Care Note  Patient: Nathan HartshornGerry W Halbig  Procedure(s) Performed: Procedure(s): ARTERIOVENOUS (AV) FISTULA CREATION ,BRACHIOCEPHALIC (Left)  Patient Location: PACU  Anesthesia Type:General  Level of Consciousness: sedated  Airway & Oxygen Therapy: Patient Spontanous Breathing and Patient connected to face mask oxygen  Post-op Assessment: Report given to RN  Post vital signs: Reviewed and stable  Last Vitals:  Filed Vitals:   12/11/15 1342 12/11/15 1608  BP: 168/76 127/69  Pulse: 68 63  Temp: 36.8 C 36.4 C  Resp: 16 16    Complications: No apparent anesthesia complications

## 2015-12-11 NOTE — Anesthesia Procedure Notes (Signed)
Procedure Name: LMA Insertion Date/Time: 12/11/2015 2:48 PM Performed by: Lily KocherPERALTA, Michelle Wnek Pre-anesthesia Checklist: Patient identified, Patient being monitored, Timeout performed, Emergency Drugs available and Suction available Patient Re-evaluated:Patient Re-evaluated prior to inductionOxygen Delivery Method: Circle system utilized Preoxygenation: Pre-oxygenation with 100% oxygen Intubation Type: IV induction Ventilation: Mask ventilation without difficulty LMA: LMA inserted LMA Size: 4.5 Tube type: Oral Number of attempts: 1 Placement Confirmation: positive ETCO2 and breath sounds checked- equal and bilateral Tube secured with: Tape Dental Injury: Teeth and Oropharynx as per pre-operative assessment

## 2015-12-11 NOTE — Op Note (Signed)
[facility]   OPERATIVE NOTE   PROCEDURE: Left brachiocephalic arteriovenous fistula placement  PRE-OPERATIVE DIAGNOSIS: 1.  Severe CKD nearing dialysis dependence        POST-OPERATIVE DIAGNOSIS: 1. Same as above  SURGEON: Festus BarrenJason Dew, MD  ASSISTANT(S): Raul DelKim Stegmayer, PA-C  ANESTHESIA: general  ESTIMATED BLOOD LOSS: 25 cc  FINDING(S): Adequate cephalic vein for fistula creation  SPECIMEN(S):  none  INDICATIONS:   Nathan Freeman is a 65 y.o. male who presents with renal failure in need of pemanent dialysis acces.  The patient is scheduled for left arm AVF placement.  The patient is aware the risks include but are not limited to: bleeding, infection, steal syndrome, nerve damage, ischemic monomelic neuropathy, failure to mature, and need for additional procedures.  The patient is aware of the risks of the procedure and elects to proceed forward.  DESCRIPTION: After full informed written consent was obtained from the patient, the patient was brought back to the operating room and placed supine upon the operating table.  Prior to induction, the patient received IV antibiotics.   After obtaining adequate anesthesia, the patient was then prepped and draped in the standard fashion for a left arm access procedure.  I made a curvilinear incision at the level of the antecubital fossa and dissected through the subcutaneous tissue and fascia to gain exposure of the brachial artery.  This was noted to be patent and adequate in size for fistula creation.  This was dissected out proximally and distally and prepared for control with vessel loops.  I then dissected out the cephalic vein.  This was noted to be patent and adequate in size for fistula creation.  I then gave the patient 3000 units of intravenous heparin.  The vein was marked for orientation and the distal segment of the vein was ligated with a  2-0 silk, and the vein was transected. I transected the cephalic vein  distally and the median antecubital vein distally, and used the confluence of these two veins for the opening to sew the anastomosis.  I then instilled the heparinized saline into the vein and clamped it.  At this point, I reset my exposure of the brachial artery and pulled up control on the vessel loops.  I made an arteriotomy with a #11 blade, and then I extended the arteriotomy with a Potts scissor.  I injected heparinized saline proximal and distal to this arteriotomy.  The vein was then sewn to the artery in an end-to-side configuration with a running stitch of 6-0 Prolene.  Prior to completing this anastomosis, I allowed the vein and artery to backbleed.  There was no evidence of clot from any vessels.  I completed the anastomosis in the usual fashion and then released all vessel loops and clamps.  There was a palpable  thrill in the venous outflow, and there was a palpable pulse in the artery distal to the anastomosis.  At this point, I irrigated out the surgical wound.  Surgicel was placed. There was no further active bleeding.  The subcutaneous tissue was reapproximated with a running stitch of 3-0 Vicryl.  The skin was then closed with a 4-0 Monocryl suture.  The skin was then cleaned, dried, and reinforced with Dermabond.  The patient tolerated this procedure well and was taken to the recovery room in stable condition  COMPLICATIONS: None  CONDITION: Stable   DEW,JASON    12/11/2015, 3:56 PM

## 2015-12-11 NOTE — Progress Notes (Signed)
Dr. Karlton LemonKarenz notified of potassium.  No further orders.

## 2015-12-11 NOTE — OR Nursing (Signed)
Limited range of motion left shoulder and elbow, Dr. Judi Congew, N. Peralta CRNA aware.

## 2015-12-11 NOTE — Discharge Instructions (Signed)

## 2015-12-12 ENCOUNTER — Encounter: Payer: Self-pay | Admitting: Vascular Surgery

## 2016-05-14 ENCOUNTER — Encounter: Payer: Self-pay | Admitting: *Deleted

## 2016-05-17 ENCOUNTER — Ambulatory Visit: Payer: Medicare (Managed Care) | Admitting: Anesthesiology

## 2016-05-17 ENCOUNTER — Ambulatory Visit
Admission: RE | Admit: 2016-05-17 | Discharge: 2016-05-17 | Disposition: A | Discharge status: Home / Self Care | Payer: Medicare (Managed Care) | Source: Ambulatory Visit | Attending: Gastroenterology | Admitting: Gastroenterology

## 2016-05-17 ENCOUNTER — Encounter: Payer: Self-pay | Admitting: *Deleted

## 2016-05-17 ENCOUNTER — Encounter
Admission: RE | Disposition: A | Discharge status: Home / Self Care | Payer: Self-pay | Source: Ambulatory Visit | Attending: Gastroenterology

## 2016-05-17 DIAGNOSIS — Z8673 Personal history of transient ischemic attack (TIA), and cerebral infarction without residual deficits: Secondary | ICD-10-CM | POA: Diagnosis not present

## 2016-05-17 DIAGNOSIS — E46 Unspecified protein-calorie malnutrition: Secondary | ICD-10-CM | POA: Insufficient documentation

## 2016-05-17 DIAGNOSIS — Z8249 Family history of ischemic heart disease and other diseases of the circulatory system: Secondary | ICD-10-CM | POA: Diagnosis not present

## 2016-05-17 DIAGNOSIS — Z992 Dependence on renal dialysis: Secondary | ICD-10-CM | POA: Diagnosis not present

## 2016-05-17 DIAGNOSIS — I132 Hypertensive heart and chronic kidney disease with heart failure and with stage 5 chronic kidney disease, or end stage renal disease: Secondary | ICD-10-CM | POA: Insufficient documentation

## 2016-05-17 DIAGNOSIS — M5136 Other intervertebral disc degeneration, lumbar region: Secondary | ICD-10-CM | POA: Insufficient documentation

## 2016-05-17 DIAGNOSIS — E785 Hyperlipidemia, unspecified: Secondary | ICD-10-CM | POA: Insufficient documentation

## 2016-05-17 DIAGNOSIS — K640 First degree hemorrhoids: Secondary | ICD-10-CM | POA: Diagnosis not present

## 2016-05-17 DIAGNOSIS — G4730 Sleep apnea, unspecified: Secondary | ICD-10-CM | POA: Insufficient documentation

## 2016-05-17 DIAGNOSIS — Z79899 Other long term (current) drug therapy: Secondary | ICD-10-CM | POA: Diagnosis not present

## 2016-05-17 DIAGNOSIS — Z1211 Encounter for screening for malignant neoplasm of colon: Secondary | ICD-10-CM | POA: Diagnosis present

## 2016-05-17 DIAGNOSIS — K5730 Diverticulosis of large intestine without perforation or abscess without bleeding: Secondary | ICD-10-CM | POA: Insufficient documentation

## 2016-05-17 DIAGNOSIS — I509 Heart failure, unspecified: Secondary | ICD-10-CM | POA: Diagnosis not present

## 2016-05-17 DIAGNOSIS — N185 Chronic kidney disease, stage 5: Secondary | ICD-10-CM | POA: Diagnosis not present

## 2016-05-17 DIAGNOSIS — Z7982 Long term (current) use of aspirin: Secondary | ICD-10-CM | POA: Diagnosis not present

## 2016-05-17 DIAGNOSIS — Z6823 Body mass index (BMI) 23.0-23.9, adult: Secondary | ICD-10-CM | POA: Insufficient documentation

## 2016-05-17 DIAGNOSIS — F329 Major depressive disorder, single episode, unspecified: Secondary | ICD-10-CM | POA: Insufficient documentation

## 2016-05-17 DIAGNOSIS — Z87891 Personal history of nicotine dependence: Secondary | ICD-10-CM | POA: Insufficient documentation

## 2016-05-17 DIAGNOSIS — E1122 Type 2 diabetes mellitus with diabetic chronic kidney disease: Secondary | ICD-10-CM | POA: Insufficient documentation

## 2016-05-17 HISTORY — PX: COLONOSCOPY WITH PROPOFOL: SHX5780

## 2016-05-17 LAB — GLUCOSE, CAPILLARY: GLUCOSE-CAPILLARY: 117 mg/dL — AB (ref 65–99)

## 2016-05-17 SURGERY — COLONOSCOPY WITH PROPOFOL
Anesthesia: General

## 2016-05-17 MED ORDER — SODIUM CHLORIDE 0.9 % IV SOLN: INTRAVENOUS | Status: DC

## 2016-05-17 MED ORDER — FENTANYL CITRATE (PF) 100 MCG/2ML IJ SOLN
INTRAMUSCULAR | Status: DC | PRN
  Administered 2016-05-17: 50 ug via INTRAVENOUS

## 2016-05-17 MED ORDER — PROPOFOL 10 MG/ML IV BOLUS
INTRAVENOUS | Status: DC | PRN
  Administered 2016-05-17: 100 mg via INTRAVENOUS

## 2016-05-17 MED ORDER — LIDOCAINE 2% (20 MG/ML) 5 ML SYRINGE
INTRAMUSCULAR | Status: DC | PRN
  Administered 2016-05-17: 40 mg via INTRAVENOUS

## 2016-05-17 MED ORDER — PHENYLEPHRINE HCL 10 MG/ML IJ SOLN
INTRAMUSCULAR | Status: DC | PRN
  Administered 2016-05-17: 100 ug via INTRAVENOUS

## 2016-05-17 MED ORDER — SODIUM CHLORIDE 0.9 % IV SOLN
INTRAVENOUS | Status: DC | PRN
  Administered 2016-05-17: 08:00:00 via INTRAVENOUS

## 2016-05-17 MED ORDER — MIDAZOLAM HCL 5 MG/5ML IJ SOLN
INTRAMUSCULAR | Status: DC | PRN
  Administered 2016-05-17: 1 mg via INTRAVENOUS

## 2016-05-17 MED ORDER — PROPOFOL 500 MG/50ML IV EMUL
INTRAVENOUS | Status: DC | PRN
  Administered 2016-05-17: 120 ug/kg/min via INTRAVENOUS

## 2016-05-17 NOTE — Transfer of Care (Signed)
Immediate Anesthesia Transfer of Care Note  Patient: Nathan Freeman  Procedure(s) Performed: Procedure(s): COLONOSCOPY WITH PROPOFOL (N/A)  Patient Location: PACU and Endoscopy Unit  Anesthesia Type:General  Level of Consciousness: sedated  Airway & Oxygen Therapy: Patient Spontanous Breathing and Patient connected to nasal cannula oxygen  Post-op Assessment: Report given to RN and Post -op Vital signs reviewed and stable  Post vital signs: Reviewed and stable  Last Vitals:  Vitals:   05/17/16 0806  BP: 135/61  Pulse: 67  Resp: 18  Temp: 36.1 C    Last Pain:  Vitals:   05/17/16 0806  TempSrc: Oral  PainSc: 0-No pain         Complications: No apparent anesthesia complications

## 2016-05-17 NOTE — H&P (Signed)
Outpatient short stay form Pre-procedure 05/17/2016 8:35 AM Christena Deem MD  Primary Physician: Melina Modena  NP  Reason for visit:  Screening colonoscopy  History of present illness:  Patient is a 65 year old male presenting as above. His last colonoscopy was in 2006 showing diverticulosis. He tolerated his prep well. He does take 81 mg aspirin as well as Plavix however he is held the Plavix for at least 5 days.    Current Facility-Administered Medications:  .  0.9 %  sodium chloride infusion, , Intravenous, Continuous, Christena Deem, MD  Facility-Administered Medications Ordered in Other Encounters:  .  0.9 %  sodium chloride infusion, , , Continuous PRN, Charna Busman, CRNA  Prescriptions Prior to Admission  Medication Sig Dispense Refill Last Dose  . amLODipine (NORVASC) 5 MG tablet Take 10 mg by mouth daily.   Past Week at Unknown time  . aspirin 81 MG EC tablet Take 81 mg by mouth daily.     Past Week at Unknown time  . buPROPion (WELLBUTRIN SR) 150 MG 12 hr tablet Take 150 mg by mouth daily.   Past Week at Unknown time  . carvedilol (COREG) 12.5 MG tablet Take 12.5 mg by mouth 2 (two) times daily with a meal.   Past Week at Unknown time  . citalopram (CELEXA) 40 MG tablet Take 40 mg by mouth daily.   Past Week at Unknown time  . clopidogrel (PLAVIX) 75 MG tablet Take 75 mg by mouth daily.   Past Week at Unknown time  . ergocalciferol (VITAMIN D2) 50000 units capsule Take 50,000 Units by mouth.   Past Week at Unknown time  . fish oil-omega-3 fatty acids 1000 MG capsule Take 1 capsule by mouth daily.     Past Week at Unknown time  . glipiZIDE (GLUCOTROL) 10 MG 24 hr tablet Take 10 mg by mouth daily.     Past Week at Unknown time  . hydrALAZINE (APRESOLINE) 25 MG tablet Take 25 mg by mouth 2 (two) times daily.   Past Week at Unknown time  . HYDROcodone-acetaminophen (NORCO) 5-325 MG tablet Take 1 tablet by mouth every 6 (six) hours as needed for moderate pain. 30  tablet 0 Past Week at Unknown time  . insulin aspart protamine- aspart (NOVOLOG MIX 70/30) (70-30) 100 UNIT/ML injection Inject into the skin as needed. None x 1 month   Past Month at Unknown time  . lisinopril (PRINIVIL,ZESTRIL) 20 MG tablet Take 20 mg by mouth daily.     Past Week at Unknown time  . ondansetron (ZOFRAN) 4 MG tablet Take 4 mg by mouth every 8 (eight) hours as needed for nausea or vomiting.   Past Week at Unknown time  . pantoprazole (PROTONIX) 40 MG tablet Take 40 mg by mouth daily.   Past Week at Unknown time  . potassium chloride SA (K-DUR,KLOR-CON) 20 MEQ tablet Take 20 mEq by mouth daily. Only as needed   Past Week at Unknown time  . rosuvastatin (CRESTOR) 10 MG tablet Take 10 mg by mouth daily.   Past Week at Unknown time  . torsemide (DEMADEX) 20 MG tablet Take 20 mg by mouth 3 (three) times daily.   Past Week at Unknown time  . traMADol (ULTRAM) 50 MG tablet Take by mouth every 6 (six) hours as needed.   Past Week at Unknown time     No Known Allergies   Past Medical History:  Diagnosis Date  . Acute pericarditis    Probable, Echo (  7/10) showed mild LVH, normal LV size and systolic function, EF 55-60%, normal wall motion, mild MR, no AS, partially loculated small effusion along the RA free wall  . Anemia   . CHF (congestive heart failure) (HCC)   . DDD (degenerative disc disease), lumbar    Spine  . Depression   . Diabetes mellitus type II    Poor control due to medication noncompliance. Last hgbA1c 13.2  . Edema   . Hepatitis, unspecified   . Hyperlipidemia   . Hyperparathyroidism (HCC)   . Hypertension    Cough with a BP med in teh past, sounds like ACEI  . Kidney stones    dialysis t,t,s  . Malnutrition (HCC)   . Sleep apnea    cpap  . Stroke (HCC)    02/2013 left side weakness    Review of systems:      Physical Exam    Heart and lungs: Irregular rhythm    HEENT: Normocephalic atraumatic eyes are anicteric    Other:     Pertinant exam  for procedure: Soft nontender nondistended bowel sounds positive normoactive. Possible small periumbilical hernia that is nontender.    Planned proceedures: Colonoscopy and indicated procedures. I have discussed the risks benefits and complications of procedures to include not limited to bleeding, infection, perforation and the risk of sedation and the patient wishes to proceed.    Christena DeemMartin U Skulskie, MD Gastroenterology 05/17/2016  8:35 AM

## 2016-05-17 NOTE — Op Note (Signed)
[facility] Patient Name: Nathan RobertGerry Freeman Procedure Date: 05/17/2016 8:34 AM MRN: [id number] Account #: [id number] Date of Birth: 06/04/1951 Admit Type: Outpatient Age: [age] [facility] Gender: Male Note Status: Finalized Procedure:            Colonoscopy Indications:          Screening for colorectal malignant neoplasm Providers:            Christena DeemMartin U. Skulskie, MD Medicines:            Monitored Anesthesia Care Complications:        No immediate complications. Procedure:            Pre-Anesthesia Assessment:                       - ASA Grade Assessment: III - A patient with severe                        systemic disease.                       After obtaining informed consent, the colonoscope was                        passed under direct vision. Throughout the procedure,                        the patient's blood pressure, pulse, and oxygen                        saturations were monitored continuously. The                        Colonoscope was introduced through the anus and                        advanced to the the cecum, identified by appendiceal                        orifice and ileocecal valve. The colonoscopy was                        performed without difficulty. The quality of the bowel                        preparation was good. Findings:      Multiple small and large-mouthed diverticula were found in the sigmoid       colon and distal descending colon.      Non-bleeding internal hemorrhoids were found during retroflexion and       during anoscopy. The hemorrhoids were small and Grade I (internal       hemorrhoids that do not prolapse).      The exam was otherwise normal throughout the examined colon.      No additional abnormalities were found on retroflexion.      The digital rectal exam was normal. Impression:           - Diverticulosis in the sigmoid colon and in the distal                        descending colon.                   -  Non-bleeding internal hemorrhoids.                       - No specimens collected. Recommendation:       - Discharge patient to home. Procedure Code(s):    --- Professional ---                       [phone], Colonoscopy, flexible; diagnostic, including                        collection of specimen(s) by brushing or washing, when                        performed (separate procedure) Diagnosis Code(s):    --- Professional ---                       Z12.11, Encounter for screening for malignant neoplasm                        of colon                       K64.0, First degree hemorrhoids                       K57.30, Diverticulosis of large intestine without                        perforation or abscess without bleeding CPT copyright 2016 [employer]. All rights reserved. The codes documented in this report are preliminary and upon coder review may  be revised to meet current compliance requirements. Christena Deem, MD 05/17/2016 9:23:43 AM This report has been signed electronically. Number of Addenda: 0 Note Initiated On: 05/17/2016 8:34 AM Scope Withdrawal Time: 0 hours 12 minutes 51 seconds  Total Procedure Duration: 0 hours 19 minutes 53 seconds       [facility]

## 2016-05-17 NOTE — Anesthesia Preprocedure Evaluation (Signed)
Anesthesia Evaluation    Airway Mallampati: III       Dental  (+) Teeth Intact   Pulmonary shortness of breath and with exertion, sleep apnea and Continuous Positive Airway Pressure Ventilation , former smoker,           Cardiovascular hypertension, +CHF  (-) Orthopnea and (-) PND  Rhythm:regular Rate:Normal     Neuro/Psych PSYCHIATRIC DISORDERS CVA, Residual Symptoms    GI/Hepatic   Endo/Other  diabetes  Renal/GU      Musculoskeletal   Abdominal   Peds  Hematology   Anesthesia Other Findings   Reproductive/Obstetrics                             Anesthesia Physical Anesthesia Plan  ASA: III  Anesthesia Plan: General   Post-op Pain Management:    Induction:   Airway Management Planned:   Additional Equipment:   Intra-op Plan:   Post-operative Plan:   Informed Consent:   Plan Discussed with:   Anesthesia Plan Comments:         Anesthesia Quick Evaluation

## 2016-05-17 NOTE — Anesthesia Postprocedure Evaluation (Signed)
Anesthesia Post Note  Patient: Nathan Freeman  Procedure(s) Performed: Procedure(s) (LRB): COLONOSCOPY WITH PROPOFOL (N/A)  Patient location during evaluation: PACU Anesthesia Type: General Level of consciousness: awake and alert Pain management: pain level controlled Vital Signs Assessment: post-procedure vital signs reviewed and stable Respiratory status: spontaneous breathing, nonlabored ventilation, respiratory function stable and patient connected to nasal cannula oxygen Cardiovascular status: blood pressure returned to baseline and stable Postop Assessment: no signs of nausea or vomiting Anesthetic complications: no    Last Vitals:  Vitals:   05/17/16 0927 05/17/16 0930  BP: (!) 80/35   Pulse: (!) 56   Resp: 11   Temp: 36.2 C 36.3 C    Last Pain:  Vitals:   05/17/16 0920  TempSrc: Tympanic  PainSc:                  Yevette EdwardsJames G Adams

## 2016-07-01 ENCOUNTER — Encounter: Payer: Self-pay | Admitting: Gastroenterology

## 2016-10-18 ENCOUNTER — Other Ambulatory Visit (INDEPENDENT_AMBULATORY_CARE_PROVIDER_SITE_OTHER): Payer: Self-pay | Admitting: Vascular Surgery

## 2016-10-18 ENCOUNTER — Encounter (INDEPENDENT_AMBULATORY_CARE_PROVIDER_SITE_OTHER): Payer: Medicare (Managed Care)

## 2016-10-18 ENCOUNTER — Ambulatory Visit (INDEPENDENT_AMBULATORY_CARE_PROVIDER_SITE_OTHER): Payer: Self-pay | Admitting: Vascular Surgery

## 2016-10-18 DIAGNOSIS — N186 End stage renal disease: Secondary | ICD-10-CM

## 2016-10-18 DIAGNOSIS — T829XXD Unspecified complication of cardiac and vascular prosthetic device, implant and graft, subsequent encounter: Secondary | ICD-10-CM

## 2017-12-29 ENCOUNTER — Emergency Department: Payer: Medicare (Managed Care)

## 2017-12-29 ENCOUNTER — Other Ambulatory Visit: Payer: Self-pay

## 2017-12-29 ENCOUNTER — Emergency Department
Admission: EM | Admit: 2017-12-29 | Discharge: 2017-12-29 | Disposition: A | Discharge status: Home / Self Care | Payer: Medicare (Managed Care) | Attending: Emergency Medicine | Admitting: Emergency Medicine

## 2017-12-29 DIAGNOSIS — W01118A Fall on same level from slipping, tripping and stumbling with subsequent striking against other sharp object, initial encounter: Secondary | ICD-10-CM | POA: Diagnosis not present

## 2017-12-29 DIAGNOSIS — Z87891 Personal history of nicotine dependence: Secondary | ICD-10-CM | POA: Insufficient documentation

## 2017-12-29 DIAGNOSIS — M25512 Pain in left shoulder: Secondary | ICD-10-CM

## 2017-12-29 DIAGNOSIS — Z992 Dependence on renal dialysis: Secondary | ICD-10-CM | POA: Diagnosis not present

## 2017-12-29 DIAGNOSIS — I132 Hypertensive heart and chronic kidney disease with heart failure and with stage 5 chronic kidney disease, or end stage renal disease: Secondary | ICD-10-CM | POA: Insufficient documentation

## 2017-12-29 DIAGNOSIS — Z794 Long term (current) use of insulin: Secondary | ICD-10-CM | POA: Diagnosis not present

## 2017-12-29 DIAGNOSIS — Y92538 Other ambulatory health services establishments as the place of occurrence of the external cause: Secondary | ICD-10-CM | POA: Insufficient documentation

## 2017-12-29 DIAGNOSIS — N186 End stage renal disease: Secondary | ICD-10-CM | POA: Insufficient documentation

## 2017-12-29 DIAGNOSIS — S40812A Abrasion of left upper arm, initial encounter: Secondary | ICD-10-CM | POA: Diagnosis not present

## 2017-12-29 DIAGNOSIS — I509 Heart failure, unspecified: Secondary | ICD-10-CM | POA: Diagnosis not present

## 2017-12-29 DIAGNOSIS — Y999 Unspecified external cause status: Secondary | ICD-10-CM | POA: Insufficient documentation

## 2017-12-29 DIAGNOSIS — S0990XA Unspecified injury of head, initial encounter: Secondary | ICD-10-CM | POA: Diagnosis present

## 2017-12-29 DIAGNOSIS — E119 Type 2 diabetes mellitus without complications: Secondary | ICD-10-CM | POA: Insufficient documentation

## 2017-12-29 DIAGNOSIS — Y9389 Activity, other specified: Secondary | ICD-10-CM | POA: Insufficient documentation

## 2017-12-29 DIAGNOSIS — G8911 Acute pain due to trauma: Secondary | ICD-10-CM | POA: Diagnosis not present

## 2017-12-29 DIAGNOSIS — S20212A Contusion of left front wall of thorax, initial encounter: Secondary | ICD-10-CM | POA: Diagnosis not present

## 2017-12-29 DIAGNOSIS — W19XXXA Unspecified fall, initial encounter: Secondary | ICD-10-CM

## 2017-12-29 LAB — CBC
HCT: 34.5 % — ABNORMAL LOW (ref 40.0–52.0)
HEMOGLOBIN: 11.9 g/dL — AB (ref 13.0–18.0)
MCH: 33.7 pg (ref 26.0–34.0)
MCHC: 34.4 g/dL (ref 32.0–36.0)
MCV: 98 fL (ref 80.0–100.0)
Platelets: 126 10*3/uL — ABNORMAL LOW (ref 150–440)
RBC: 3.52 MIL/uL — AB (ref 4.40–5.90)
RDW: 15.6 % — ABNORMAL HIGH (ref 11.5–14.5)
WBC: 4.5 10*3/uL (ref 3.8–10.6)

## 2017-12-29 LAB — BASIC METABOLIC PANEL
ANION GAP: 8 (ref 5–15)
BUN: 22 mg/dL — ABNORMAL HIGH (ref 6–20)
CALCIUM: 8.4 mg/dL — AB (ref 8.9–10.3)
CO2: 30 mmol/L (ref 22–32)
Chloride: 100 mmol/L — ABNORMAL LOW (ref 101–111)
Creatinine, Ser: 2.55 mg/dL — ABNORMAL HIGH (ref 0.61–1.24)
GFR, EST AFRICAN AMERICAN: 29 mL/min — AB (ref >60)
GFR, EST NON AFRICAN AMERICAN: 25 mL/min — AB (ref >60)
Glucose, Bld: 123 mg/dL — ABNORMAL HIGH (ref 65–99)
Potassium: 3.5 mmol/L (ref 3.5–5.1)
Sodium: 138 mmol/L (ref 135–145)

## 2017-12-29 MED ORDER — ACETAMINOPHEN 500 MG PO TABS
1000.0000 mg | ORAL_TABLET | Freq: Once | ORAL | Status: AC
  Administered 2017-12-29: 1000 mg via ORAL
  Filled 2017-12-29: qty 2

## 2017-12-29 NOTE — ED Notes (Signed)
Patient transported to X-ray 

## 2017-12-29 NOTE — ED Notes (Signed)
Pt states he was at dialysis this morning and immediately after dialysis he got dizzy and fell - pt c/o left shoulder and arm pain - he states that he has been weak for the last week

## 2017-12-29 NOTE — Discharge Instructions (Addendum)
Your CT scan of the head was unremarkable today. Your xrays of the neck and left shoulder also did not show any acute injuries. Follow up with your primary care doctor for continued monitoring of your symptoms.

## 2017-12-29 NOTE — ED Triage Notes (Addendum)
Pt comes via [facility] with c/o fall. Pt has abrasion with bandage noted to left elbow near dialysis access. Pt also complaining of left shoulder pain. Pt states he has had multiple falls recently and fell today. Pt also had a fall yesterday and states trouble keeping his balance. Pt states his left side is weaker due to previous stroke. Pt states he does use a walker. Pt states he did hit his head, but denies LOC. Pt takes 2 baby aspirin daily.

## 2017-12-29 NOTE — ED Provider Notes (Signed)
[facility] Emergency Department Provider Note  ____________________________________________  Time seen: Approximately 4:06 PM  I have reviewed the triage vital signs and the nursing notes.   HISTORY  Chief Complaint Fall    HPI Nathan Freeman is a 67 y.o. male with a history of pericarditis, end-stage renal disease on dialysis comes to the ED after dialysis today due to a fall. He completed his dialysis session uneventfully without any pain or shortness of breath or other issues, and when he went to leave he had a trip and fall, hitting the left shoulder and left arm and chest wall on a table. He also hit the left side of his head. He complains of some neck pain but has been ambulatory since then. No loss of consciousness. Pain is sudden, acute, moderate intensity, aching, nonradiating worse with arm movement, no alleviating factors.    the patient's nurse practitioner [facility] came to the ED and provided a lot of context as far as past medical history. She notes that the patient has been staying in bed for many weeks due to his spouse being on home hospice, he has been depressed, and has become deconditioned. He has a history of stroke that his left him with residual weakness on the left side, he has several falls in the past due to deconditioning. He is agreeable to working with the clinic to treat his depression and obtain PT OT. [facility] has made respite care available to him right away if he desires, but he and the family so far wished to continue managing this at home.  patient denies SI HI or hallucinations.   Past Medical History:  Diagnosis Date  . Acute pericarditis    Probable, Echo (7/10) showed mild LVH, normal LV size and systolic function, EF 55-60%, normal wall motion, mild MR, no AS, partially loculated small effusion along the RA free wall  . Anemia   . CHF (congestive heart failure) (HCC)   . DDD (degenerative disc disease),  lumbar    Spine  . Depression   . Diabetes mellitus type II    Poor control due to medication noncompliance. Last hgbA1c 13.2  . Edema   . Hepatitis, unspecified   . Hyperlipidemia   . Hyperparathyroidism (HCC)   . Hypertension    Cough with a BP med in teh past, sounds like ACEI  . Kidney stones    dialysis t,t,s  . Malnutrition (HCC)   . Sleep apnea    cpap  . Stroke [facility])    02/2013 left side weakness     Patient Active Problem List   Diagnosis Date Noted  . HYPERLIPIDEMIA-MIXED 04/03/2009  . HYPERTENSION, UNSPECIFIED 04/03/2009  . PERICARDITIS, ACUTE 04/03/2009  . SHORTNESS OF BREATH 04/03/2009     Past Surgical History:  Procedure Laterality Date  . AV FISTULA PLACEMENT Left 12/11/2015   Procedure: ARTERIOVENOUS (AV) FISTULA CREATION ,BRACHIOCEPHALIC;  Surgeon: Annice Needy, MD;  Location: [facility] ORS;  Service: Vascular;  Laterality: Left;  . COLONOSCOPY    . COLONOSCOPY WITH PROPOFOL N/A 05/17/2016   Procedure: COLONOSCOPY WITH PROPOFOL;  Surgeon: Christena Deem, MD;  Location: [facility] ENDOSCOPY;  Service: Endoscopy;  Laterality: N/A;  . INSERTION OF DIALYSIS CATHETER Right    chest  . LITHOTRIPSY    . ROTATOR CUFF REPAIR     Left shoulder, 2 times  . TONSILLECTOMY       Prior to Admission medications   Medication Sig Start Date End  Date Taking? Authorizing Provider  amLODipine (NORVASC) 5 MG tablet Take 10 mg by mouth daily.    [provider]  aspirin 81 MG EC tablet Take 81 mg by mouth daily.      [provider]  buPROPion (WELLBUTRIN SR) 150 MG 12 hr tablet Take 150 mg by mouth daily.    [provider]  carvedilol (COREG) 12.5 MG tablet Take 12.5 mg by mouth 2 (two) times daily with a meal.    [provider]  citalopram (CELEXA) 40 MG tablet Take 40 mg by mouth daily.    [provider]  clopidogrel (PLAVIX) 75 MG tablet Take 75 mg by mouth daily.    [provider]  ergocalciferol (VITAMIN D2) 50000  units capsule Take 50,000 Units by mouth.    [provider]  fish oil-omega-3 fatty acids 1000 MG capsule Take 1 capsule by mouth daily.      [provider]  glipiZIDE (GLUCOTROL) 10 MG 24 hr tablet Take 10 mg by mouth daily.      [provider]  hydrALAZINE (APRESOLINE) 25 MG tablet Take 25 mg by mouth 2 (two) times daily.    [provider]  HYDROcodone-acetaminophen (NORCO) 5-325 MG tablet Take 1 tablet by mouth every 6 (six) hours as needed for moderate pain. 12/11/15   Annice Needy, MD  insulin aspart protamine- aspart (NOVOLOG MIX 70/30) (70-30) 100 UNIT/ML injection Inject into the skin as needed. None x 1 month    [provider]  lisinopril (PRINIVIL,ZESTRIL) 20 MG tablet Take 20 mg by mouth daily.      [provider]  ondansetron (ZOFRAN) 4 MG tablet Take 4 mg by mouth every 8 (eight) hours as needed for nausea or vomiting.    [provider]  pantoprazole (PROTONIX) 40 MG tablet Take 40 mg by mouth daily.    [provider]  potassium chloride SA (K-DUR,KLOR-CON) 20 MEQ tablet Take 20 mEq by mouth daily. Only as needed    [provider]  rosuvastatin (CRESTOR) 10 MG tablet Take 10 mg by mouth daily.    [provider]  torsemide (DEMADEX) 20 MG tablet Take 20 mg by mouth 3 (three) times daily.    [provider]  traMADol (ULTRAM) 50 MG tablet Take by mouth every 6 (six) hours as needed.    [provider]     Allergies Patient has no known allergies.   Family History  Problem Relation Age of Onset  . Heart failure Mother   . Ulcers Mother        Venous ulcers  . Heart failure Father   . Heart attack Brother        3 heart attacks    Social History Social History   Tobacco Use  . Smoking status: Former Smoker    Types: Cigarettes    Last attempt to quit: 09/20/1972    Years since quitting: 45.3  . Smokeless tobacco: Never Used  . Tobacco comment: Quit at age  11  Substance Use Topics  . Alcohol use: No  . Drug use: No    Review of Systems  Constitutional:   No fever or chills.  ENT:   No sore throat. No rhinorrhea. Cardiovascular:   No chest pain or syncope. Respiratory:   No dyspnea or cough. Gastrointestinal:   Negative for abdominal pain, vomiting and diarrhea.  Musculoskeletal:  left arm and shoulder pain, neck pain All other systems reviewed and are  negative except as documented above in ROS and HPI.  ____________________________________________   PHYSICAL EXAM:  VITAL SIGNS: ED Triage Vitals [12/29/17 1037]  Enc Vitals Group     BP 136/72     Pulse Rate 66     Resp 18     Temp 98.9 F (37.2 C)     Temp Source Oral     SpO2 99 %     Weight 182 lb (82.6 kg)     Height 6' (1.829 m)     Head Circumference      Peak Flow      Pain Score 5     Pain Loc      Pain Edu?      Excl. in GC?     Vital signs reviewed, nursing assessments reviewed.   Constitutional:   Alert and oriented. Well appearing and in no distress. Eyes:   Conjunctivae are normal. EOMI. PERRL. ENT      Head:   Normocephalic and atraumatic.      Nose:   No congestion/rhinnorhea.       Mouth/Throat:   MMM, no pharyngeal erythema. No peritonsillar mass.       Neck:   No meningismus. Full ROM. slight midline tenderness at the upper C-spine area of C3 without step-off or crepitus. Tenderness in the paraspinous musculature as well. Hematological/Lymphatic/Immunilogical:   No cervical lymphadenopathy. Cardiovascular:   RRR. Symmetric bilateral radial and DP pulses.  No murmurs. left upper extremity AV fistula hemostatic. No apparent injury to this area. Good thrill. Respiratory:   Normal respiratory effort without tachypnea/retractions. Breath sounds are clear and equal bilaterally. No wheezes/rales/rhonchi. Gastrointestinal:   Soft and nontender. Non distended. There is no CVA tenderness.  No rebound, rigidity, or guarding. Genitourinary:    deferred Musculoskeletal:   Normal range of motion in all extremities. No joint effusions.  No lower extremity tenderness.  No edema. left shoulder nontender, no focal bony tenderness. Chest wall is stable. Pelvis and hips are stable. Neurologic:   Normal speech and language.  Motor grossly intact. No acute focal neurologic deficits are appreciated.  Skin:    Skin is warm, dry with 1 cm skin abrasion on the left dorsal upper arm, hemostatic..  ____________________________________________    LABS (pertinent positives/negatives) (all labs ordered are listed, but only abnormal results are displayed) Labs Reviewed  BASIC METABOLIC PANEL - Abnormal; Notable for the following components:      Result Value   Chloride 100 (*)    Glucose, Bld 123 (*)    BUN 22 (*)    Creatinine, Ser 2.55 (*)    Calcium 8.4 (*)    GFR calc non Af Amer 25 (*)    GFR calc Af Amer 29 (*)    All other components within normal limits  CBC - Abnormal; Notable for the following components:   RBC 3.52 (*)    Hemoglobin 11.9 (*)    HCT 34.5 (*)    RDW 15.6 (*)    Platelets 126 (*)    All other components within normal limits  CBG MONITORING, ED   ____________________________________________   EKG  interpreted by me Sinus rhythm rate of 65, normal axis , first-degree AV block. Normal QRS ST segments and T waves.  ____________________________________________    RADIOLOGY  Dg Cervical Spine Complete  Result Date: 12/29/2017 CLINICAL DATA:  Pain after fall. EXAM: CERVICAL SPINE - COMPLETE 4+ VIEW COMPARISON:  None. FINDINGS: Flowing anterior osteophytes are identified at all visualized levels.  The cervical spine is only well seen through C6 on the initial lateral view into the mid C7 level on the swimmer's view. The pre odontoid space is normal. Prevertebral soft tissues anterior to the flowing osteophytes are normal. No fractures are identified. The neural foramina are patent within visualized limits. The  lateral masses of C1 align with C2. Visualized portions of the odontoid process are normal. IMPRESSION: 1. No evidence of fracture or traumatic malalignment. 2. Diffuse flowing anterior osteophytes. Electronically Signed   By: Gerome Sam III M.D   On: 12/29/2017 14:21   Ct Head Wo Contrast  Result Date: 12/29/2017 CLINICAL DATA:  Head trauma, ataxia EXAM: CT HEAD WITHOUT CONTRAST TECHNIQUE: Contiguous axial images were obtained from the base of the skull through the vertex without intravenous contrast. COMPARISON:  04/01/2013 FINDINGS: Brain: No acute intracranial hemorrhage. No focal mass lesion. No CT evidence of acute infarction. No midline shift or mass effect. No hydrocephalus. Basilar cisterns are patent. There are periventricular and subcortical white matter hypodensities. Generalized cortical atrophy. Vascular: No hyperdense vessel or unexpected calcification. Skull: Normal. Negative for fracture or focal lesion. Sinuses/Orbits: Paranasal sinuses and mastoid air cells are clear. Orbits are clear. Other: None. IMPRESSION: No acute intracranial findings Atrophy and white matter microvascular disease. Electronically Signed   By: Genevive Bi M.D.   On: 12/29/2017 11:34   Dg Shoulder Left  Result Date: 12/29/2017 CLINICAL DATA:  Pain following fall EXAM: LEFT SHOULDER - 2+ VIEW COMPARISON:  None. FINDINGS: Frontal, Y scapular, and axillary images were obtained. No acute fracture or dislocation. There is generalized osteoarthritic change. There is superior migration of the humeral head. Calcification is noted inferior to the glenoid. No erosive change. Visualized left lung clear. IMPRESSION: Extensive generalized osteoarthritic change. Probable chronic rotator cuff tear with superior migration of the humeral head. 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 No acute fracture or dislocation. Electronically Signed   By: Bretta Bang III M.D.   On: 12/29/2017 11:48     ____________________________________________   PROCEDURES Procedures  ____________________________________________  DIFFERENTIAL DIAGNOSIS   muscle strain, shoulder fracture, C-spine fracture, intracranial hemorrhage  CLINICAL IMPRESSION / ASSESSMENT AND PLAN / ED COURSE  Pertinent labs & imaging results that were available during my care of the patient were reviewed by me and considered in my medical decision making (see chart for details).    patient well appearing no acute distress, chronic baseline per her primary care provider who knows him well. Only acute complaints are multiple musculoskeletal complaints after a mechanical fall. Check CT head, x-ray left shoulder, x-ray C-spine.  Clinical Course as of Dec 29 1604  Thu Dec 29, 2017  1300 has end-stage renal disease. Had dialysis today.  Creatinine(!): 2.55 [PS]    Clinical Course User Index [PS] Sharman Cheek, MD    ----------------------------------------- 4:13 PM on 12/29/2017 -----------------------------------------  Workup negative, suitable for discharge home and outpatient follow-up with primary care. Patient has a lot of support from family at home and excellent support from his primary care provider as well. They have arranged many resources for him including PT, OT, respite care.   ____________________________________________   FINAL CLINICAL IMPRESSION(S) / ED DIAGNOSES    Final diagnoses:  Fall, initial encounter  Acute pain of left shoulder  Contusion of left chest wall, initial encounter  Abrasion of arm, left, initial encounter     ED Discharge Orders    None      Portions of this note were generated with dragon  dictation software. Dictation errors may occur despite best attempts at proofreading.    Sharman Cheek, MD 12/29/17 [phone]

## 2017-12-29 NOTE — ED Triage Notes (Signed)
Pt in via EMS from dialysis. EMS reports pt finished dialysis and fell while walking out. Pt hit table when he fell. EMS reports per facility, pt los approximately 100cc's of blood. Bleeding controlled at this time. Pt denies LOC, reports an increase in dizziness and weakness over the past week.

## 2017-12-29 NOTE — ED Notes (Signed)
Spoke with pt about wait times and what to expect next. Advised pt that I am available for further questions if needed.  

## 2018-01-06 ENCOUNTER — Observation Stay
Admission: EM | Admit: 2018-01-06 | Discharge: 2018-01-08 | Disposition: A | Discharge status: Home / Self Care | Payer: Medicare (Managed Care) | Attending: Internal Medicine | Admitting: Internal Medicine

## 2018-01-06 ENCOUNTER — Encounter: Payer: Self-pay | Admitting: Emergency Medicine

## 2018-01-06 ENCOUNTER — Emergency Department: Payer: Medicare (Managed Care)

## 2018-01-06 DIAGNOSIS — I10 Essential (primary) hypertension: Secondary | ICD-10-CM | POA: Diagnosis present

## 2018-01-06 DIAGNOSIS — E875 Hyperkalemia: Secondary | ICD-10-CM | POA: Insufficient documentation

## 2018-01-06 DIAGNOSIS — Z794 Long term (current) use of insulin: Secondary | ICD-10-CM | POA: Diagnosis not present

## 2018-01-06 DIAGNOSIS — I69354 Hemiplegia and hemiparesis following cerebral infarction affecting left non-dominant side: Secondary | ICD-10-CM | POA: Insufficient documentation

## 2018-01-06 DIAGNOSIS — E119 Type 2 diabetes mellitus without complications: Secondary | ICD-10-CM | POA: Insufficient documentation

## 2018-01-06 DIAGNOSIS — I132 Hypertensive heart and chronic kidney disease with heart failure and with stage 5 chronic kidney disease, or end stage renal disease: Secondary | ICD-10-CM | POA: Diagnosis not present

## 2018-01-06 DIAGNOSIS — I5033 Acute on chronic diastolic (congestive) heart failure: Secondary | ICD-10-CM | POA: Insufficient documentation

## 2018-01-06 DIAGNOSIS — I5043 Acute on chronic combined systolic (congestive) and diastolic (congestive) heart failure: Secondary | ICD-10-CM | POA: Diagnosis present

## 2018-01-06 DIAGNOSIS — Z79899 Other long term (current) drug therapy: Secondary | ICD-10-CM | POA: Insufficient documentation

## 2018-01-06 DIAGNOSIS — F329 Major depressive disorder, single episode, unspecified: Secondary | ICD-10-CM | POA: Insufficient documentation

## 2018-01-06 DIAGNOSIS — E785 Hyperlipidemia, unspecified: Secondary | ICD-10-CM | POA: Diagnosis present

## 2018-01-06 DIAGNOSIS — Z87891 Personal history of nicotine dependence: Secondary | ICD-10-CM | POA: Insufficient documentation

## 2018-01-06 DIAGNOSIS — R0600 Dyspnea, unspecified: Secondary | ICD-10-CM

## 2018-01-06 DIAGNOSIS — N2581 Secondary hyperparathyroidism of renal origin: Secondary | ICD-10-CM | POA: Diagnosis not present

## 2018-01-06 DIAGNOSIS — R0602 Shortness of breath: Secondary | ICD-10-CM | POA: Diagnosis present

## 2018-01-06 DIAGNOSIS — D631 Anemia in chronic kidney disease: Secondary | ICD-10-CM | POA: Insufficient documentation

## 2018-01-06 DIAGNOSIS — Z7902 Long term (current) use of antithrombotics/antiplatelets: Secondary | ICD-10-CM | POA: Diagnosis not present

## 2018-01-06 DIAGNOSIS — E782 Mixed hyperlipidemia: Secondary | ICD-10-CM | POA: Insufficient documentation

## 2018-01-06 DIAGNOSIS — Z992 Dependence on renal dialysis: Secondary | ICD-10-CM | POA: Insufficient documentation

## 2018-01-06 DIAGNOSIS — E8770 Fluid overload, unspecified: Secondary | ICD-10-CM | POA: Diagnosis present

## 2018-01-06 DIAGNOSIS — G4733 Obstructive sleep apnea (adult) (pediatric): Secondary | ICD-10-CM | POA: Diagnosis not present

## 2018-01-06 DIAGNOSIS — E1122 Type 2 diabetes mellitus with diabetic chronic kidney disease: Secondary | ICD-10-CM | POA: Diagnosis not present

## 2018-01-06 DIAGNOSIS — N186 End stage renal disease: Secondary | ICD-10-CM | POA: Diagnosis not present

## 2018-01-06 DIAGNOSIS — Z7982 Long term (current) use of aspirin: Secondary | ICD-10-CM | POA: Insufficient documentation

## 2018-01-06 LAB — COMPREHENSIVE METABOLIC PANEL
ALBUMIN: 3.7 g/dL (ref 3.5–5.0)
ALK PHOS: 84 U/L (ref 38–126)
ALT: 33 U/L (ref 17–63)
AST: 24 U/L (ref 15–41)
Anion gap: 12 (ref 5–15)
BILIRUBIN TOTAL: 1.2 mg/dL (ref 0.3–1.2)
BUN: 94 mg/dL — AB (ref 6–20)
CALCIUM: 9.1 mg/dL (ref 8.9–10.3)
CO2: 24 mmol/L (ref 22–32)
CREATININE: 5.5 mg/dL — AB (ref 0.61–1.24)
Chloride: 96 mmol/L — ABNORMAL LOW (ref 101–111)
GFR calc Af Amer: 11 mL/min — ABNORMAL LOW (ref >60)
GFR calc non Af Amer: 10 mL/min — ABNORMAL LOW (ref >60)
GLUCOSE: 87 mg/dL (ref 65–99)
Potassium: 5.6 mmol/L — ABNORMAL HIGH (ref 3.5–5.1)
Sodium: 132 mmol/L — ABNORMAL LOW (ref 135–145)
Total Protein: 7.1 g/dL (ref 6.5–8.1)

## 2018-01-06 LAB — CBC
HEMATOCRIT: 35.5 % — AB (ref 40.0–52.0)
HEMOGLOBIN: 12.3 g/dL — AB (ref 13.0–18.0)
MCH: 34.1 pg — ABNORMAL HIGH (ref 26.0–34.0)
MCHC: 34.7 g/dL (ref 32.0–36.0)
MCV: 98.2 fL (ref 80.0–100.0)
Platelets: 157 10*3/uL (ref 150–440)
RBC: 3.61 MIL/uL — AB (ref 4.40–5.90)
RDW: 16.1 % — AB (ref 11.5–14.5)
WBC: 9.6 10*3/uL (ref 3.8–10.6)

## 2018-01-06 LAB — TROPONIN I: Troponin I: 0.04 ng/mL (ref <0.03)

## 2018-01-06 MED ORDER — IPRATROPIUM-ALBUTEROL 0.5-2.5 (3) MG/3ML IN SOLN
RESPIRATORY_TRACT | Status: AC
  Administered 2018-01-06: 3 mL via RESPIRATORY_TRACT
  Filled 2018-01-06: qty 3

## 2018-01-06 MED ORDER — IPRATROPIUM-ALBUTEROL 0.5-2.5 (3) MG/3ML IN SOLN
3.0000 mL | Freq: Once | RESPIRATORY_TRACT | Status: AC
  Administered 2018-01-06: 3 mL via RESPIRATORY_TRACT

## 2018-01-06 NOTE — H&P (Addendum)
[facility] - [place] at [facility]   PATIENT NAME: Nathan Freeman    MR#:  [id number]  DATE OF BIRTH:  Jan 23, 1951  DATE OF ADMISSION:  01/06/2018  PRIMARY CARE PHYSICIAN: [facility]   REQUESTING/REFERRING PHYSICIAN: Lenard Lance, MD  CHIEF COMPLAINT:   Chief Complaint  Patient presents with  . Shortness of Breath    HISTORY OF PRESENT ILLNESS:  Nathan Freeman  is a 67 y.o. male who presents with some shortness of breath and lower extremity swelling.  Patient missed a dialysis session this week.  Work-up here in the ED is consistent with fluid overload and heart failure exacerbation.  Hospitalist were called for admission  PAST MEDICAL HISTORY:   Past Medical History:  Diagnosis Date  . Acute pericarditis    Probable, Echo (7/10) showed mild LVH, normal LV size and systolic function, EF 55-60%, normal wall motion, mild MR, no AS, partially loculated small effusion along the RA free wall  . Anemia   . CHF (congestive heart failure) (HCC)   . DDD (degenerative disc disease), lumbar    Spine  . Depression   . Diabetes mellitus type II    Poor control due to medication noncompliance. Last hgbA1c 13.2  . Edema   . Hepatitis, unspecified   . Hyperlipidemia   . Hyperparathyroidism (HCC)   . Hypertension    Cough with a BP med in teh past, sounds like ACEI  . Kidney stones    dialysis t,t,s  . Malnutrition (HCC)   . Sleep apnea    cpap  . Stroke [facility])    02/2013 left side weakness     PAST SURGICAL HISTORY:   Past Surgical History:  Procedure Laterality Date  . AV FISTULA PLACEMENT Left 12/11/2015   Procedure: ARTERIOVENOUS (AV) FISTULA CREATION ,BRACHIOCEPHALIC;  Surgeon: Annice Needy, MD;  Location: [facility] ORS;  Service: Vascular;  Laterality: Left;  . COLONOSCOPY    . COLONOSCOPY WITH PROPOFOL N/A 05/17/2016   Procedure: COLONOSCOPY WITH PROPOFOL;  Surgeon: Christena Deem, MD;  Location: [facility] ENDOSCOPY;  Service: Endoscopy;   Laterality: N/A;  . INSERTION OF DIALYSIS CATHETER Right    chest  . LITHOTRIPSY    . ROTATOR CUFF REPAIR     Left shoulder, 2 times  . TONSILLECTOMY       SOCIAL HISTORY:   Social History   Tobacco Use  . Smoking status: Former Smoker    Types: Cigarettes    Last attempt to quit: 09/20/1972    Years since quitting: 45.3  . Smokeless tobacco: Never Used  . Tobacco comment: Quit at age 82  Substance Use Topics  . Alcohol use: No     FAMILY HISTORY:   Family History  Problem Relation Age of Onset  . Heart failure Mother   . Ulcers Mother        Venous ulcers  . Heart failure Father   . Heart attack Brother        3 heart attacks     DRUG ALLERGIES:  No Known Allergies  MEDICATIONS AT HOME:   Prior to Admission medications   Medication Sig Start Date End Date Taking? Authorizing Provider  acetaminophen (TYLENOL) 650 MG CR tablet Take 1,300 mg by mouth 2 (two) times daily as needed for pain (back pain).   Yes [provider]  albuterol (PROVENTIL) (2.5 MG/3ML) 0.083% nebulizer solution Take 2.5 mg by nebulization every 6 (six) hours as needed for wheezing or shortness of breath.  Yes [provider]  amLODipine (NORVASC) 5 MG tablet Take 5 mg by mouth every Tuesday, Thursday, Saturday, and Sunday.    Yes [provider]  aspirin 81 MG EC tablet Take 81 mg by mouth daily.     Yes [provider]  buPROPion (WELLBUTRIN XL) 150 MG 24 hr tablet Take 150 mg by mouth daily.   Yes [provider]  calcitRIOL (ROCALTROL) 0.25 MCG capsule Take 0.25 mcg by mouth 3 (three) times a week. On dialysis days   Yes [provider]  carvedilol (COREG) 25 MG tablet Take 25 mg by mouth 2 (two) times daily with a meal.   Yes [provider]  citalopram (CELEXA) 40 MG tablet Take 40 mg by mouth daily.   Yes [provider]  docusate sodium (COLACE) 100 MG capsule Take 100 mg by mouth daily as needed for mild  constipation.   Yes [provider]  fish oil-omega-3 fatty acids 1000 MG capsule Take 2 capsules by mouth daily with supper.    Yes [provider]  guaiFENesin (MUCINEX) 600 MG 12 hr tablet Take 600 mg by mouth 2 (two) times daily as needed for cough.   Yes [provider]  lisinopril (PRINIVIL,ZESTRIL) 10 MG tablet Take 10 mg by mouth daily.   Yes [provider]  pantoprazole (PROTONIX) 40 MG tablet Take 40 mg by mouth daily.   Yes [provider]  polycarbophil (FIBERCON) 625 MG tablet Take 625 mg by mouth 2 (two) times daily.   Yes [provider]  ranitidine (ZANTAC) 150 MG tablet Take 150 mg by mouth 2 (two) times daily.   Yes [provider]  rosuvastatin (CRESTOR) 10 MG tablet Take 10 mg by mouth daily.   Yes [provider]  sevelamer carbonate (RENVELA) 800 MG tablet Take 800 mg by mouth 3 (three) times daily with meals.   Yes [provider]  tamsulosin (FLOMAX) 0.4 MG CAPS capsule Take 0.4 mg by mouth daily.   Yes [provider]  traMADol (ULTRAM) 50 MG tablet Take 50 mg by mouth every 6 (six) hours as needed for moderate pain (back pain).    Yes [provider]  HYDROcodone-acetaminophen (NORCO) 5-325 MG tablet Take 1 tablet by mouth every 6 (six) hours as needed for moderate pain. Patient not taking: Reported on 01/06/2018 12/11/15   Annice Needyew, Jason S, MD    REVIEW OF SYSTEMS:  Review of Systems  Constitutional: Negative for chills, fever, malaise/fatigue and weight loss.  HENT: Negative for ear pain, hearing loss and tinnitus.   Eyes: Negative for blurred vision, double vision, pain and redness.  Respiratory: Positive for shortness of breath. Negative for cough and hemoptysis.   Cardiovascular: Positive for leg swelling. Negative for chest pain, palpitations and orthopnea.  Gastrointestinal: Negative for abdominal pain, constipation, diarrhea, nausea and vomiting.  Genitourinary:  Negative for dysuria, frequency and hematuria.  Musculoskeletal: Negative for back pain, joint pain and neck pain.  Skin:       No acne, rash, or lesions  Neurological: Negative for dizziness, tremors, focal weakness and weakness.  Endo/Heme/Allergies: Negative for polydipsia. Does not bruise/bleed easily.  Psychiatric/Behavioral: Negative for depression. The patient is not nervous/anxious and does not have insomnia.      VITAL SIGNS:   Vitals:   01/06/18 2104 01/06/18 2130 01/06/18 2200 01/06/18 2230  BP:  (!) 151/106 (!) 150/85 133/85  Pulse: 68 68 67 67  Resp: 19 (!) 21 (!) 27 (!)  47  Temp:      TempSrc:      SpO2: 96% 96% 94% 93%  Weight:       Wt Readings from Last 3 Encounters:  01/06/18 82.6 kg (182 lb)  12/29/17 82.6 kg (182 lb)  05/17/16 79.4 kg (175 lb)    PHYSICAL EXAMINATION:  Physical Exam  Vitals reviewed. Constitutional: He is oriented to person, place, and time. He appears well-developed and well-nourished. No distress.  HENT:  Head: Normocephalic and atraumatic.  Mouth/Throat: Oropharynx is clear and moist.  Eyes: Pupils are equal, round, and reactive to light. Conjunctivae and EOM are normal. No scleral icterus.  Neck: Normal range of motion. Neck supple. No JVD present. No thyromegaly present.  Cardiovascular: Normal rate, regular rhythm and intact distal pulses. Exam reveals no gallop and no friction rub.  No murmur heard. Respiratory: He is in respiratory distress. He has no wheezes. He has rales.  GI: Soft. Bowel sounds are normal. He exhibits no distension. There is no tenderness.  Musculoskeletal: Normal range of motion. He exhibits edema.  No arthritis, no gout  Lymphadenopathy:    He has no cervical adenopathy.  Neurological: He is alert and oriented to person, place, and time. No cranial nerve deficit.  No dysarthria, no aphasia  Skin: Skin is warm and dry. No rash noted. No erythema.  Psychiatric: He has a normal mood and affect. His behavior  is normal. Judgment and thought content normal.    LABORATORY PANEL:   CBC Recent Labs  Lab 01/06/18 2032  WBC 9.6  HGB 12.3*  HCT 35.5*  PLT 157   ------------------------------------------------------------------------------------------------------------------  Chemistries  Recent Labs  Lab 01/06/18 2032  NA 132*  K 5.6*  CL 96*  CO2 24  GLUCOSE 87  BUN 94*  CREATININE 5.50*  CALCIUM 9.1  AST 24  ALT 33  ALKPHOS 84  BILITOT 1.2   ------------------------------------------------------------------------------------------------------------------  Cardiac Enzymes Recent Labs  Lab 01/06/18 2032  TROPONINI 0.04*   ------------------------------------------------------------------------------------------------------------------  RADIOLOGY:  Dg Chest 2 View  Result Date: 01/06/2018 CLINICAL DATA:  Shortness of breath EXAM: CHEST - 2 VIEW COMPARISON:  04/11/2009 FINDINGS: Cardiac shadow is enlarged. Right basilar atelectasis and small effusion is noted. Mild left basilar atelectasis is noted as well. No bony abnormality is seen. IMPRESSION: Bibasilar atelectatic changes and right-sided pleural effusion. Electronically Signed   By: Alcide Clever M.D.   On: 01/06/2018 20:44    EKG:   Orders placed or performed during the hospital encounter of 01/06/18  . ED EKG within 10 minutes  . EKG 12-Lead  . EKG 12-Lead  . ED EKG within 10 minutes    IMPRESSION AND PLAN:  Principal Problem:   Fluid overload -likely due to his missed dialysis session.  Patient states he does still make some urine, will give a dose of Lasix tonight.  Contacted nephrology with plan for dialysis in the morning. Active Problems:   Acute on chronic combined systolic and diastolic CHF (congestive heart failure) (HCC) -due to his fluid overload, IV diuretic as above, continue home meds, dialysis as above   HTN (hypertension) -continue home meds, blood pressure goal less than 160/100   ESRD on  dialysis [facility]) -nephrology consult for dialysis as above   HLD (hyperlipidemia) -continue home meds  Chart review performed and case discussed with ED provider. Labs, imaging and/or ECG reviewed by provider and discussed with patient/family. Management plans discussed with the patient and/or family.  DVT PROPHYLAXIS: SubQ heparin  GI PROPHYLAXIS:  H2 blocker  ADMISSION STATUS: Observation  CODE STATUS: Full  TOTAL TIME TAKING CARE OF THIS PATIENT: 40 minutes.   Nivaan Dicenzo FIELDING 01/06/2018, 11:48 PM  [employer]  [phone]  CC: Primary care physician; [facility] [employer]  Note:  This document was prepared using Conservation officer, historic buildings and may include unintentional dictation errors.

## 2018-01-06 NOTE — ED Triage Notes (Signed)
Pt reports he gets dialysis x3 week and only went once this week due to the loss of his wife. Pt is SOB, coughing and swelling in bilateral legs. Pt taken to [facility] for further evaluation.

## 2018-01-06 NOTE — ED Provider Notes (Signed)
[facility] Emergency Department Provider Note  Time seen: 8:24 PM  I have reviewed the triage vital signs and the nursing notes.   HISTORY  Chief Complaint Shortness of Breath    HPI Nathan Freeman is a 67 y.o. male with a past medical history of CHF, end-stage renal disease on hemodialysis Tuesday, Thursday, Saturday, hypertension, hyperlipidemia, presents to the emergency department for shortness of breath.  According to the patient his wife passed away this week and he missed his dialysis on Thursday.  Last had dialysis this past Tuesday.  He also states cough which has been ongoing but progressively worsening over the past 2-3 weeks.  Shortness of breath over the past 2 days with increased lower extremity edema over the past several days.  Denies fever or congestion.  Denies abdominal pain nausea vomiting or diarrhea.  States he does produce a very small amount of urine each day.   Past Medical History:  Diagnosis Date  . Acute pericarditis    Probable, Echo (7/10) showed mild LVH, normal LV size and systolic function, EF 55-60%, normal wall motion, mild MR, no AS, partially loculated small effusion along the RA free wall  . Anemia   . CHF (congestive heart failure) (HCC)   . DDD (degenerative disc disease), lumbar    Spine  . Depression   . Diabetes mellitus type II    Poor control due to medication noncompliance. Last hgbA1c 13.2  . Edema   . Hepatitis, unspecified   . Hyperlipidemia   . Hyperparathyroidism (HCC)   . Hypertension    Cough with a BP med in teh past, sounds like ACEI  . Kidney stones    dialysis t,t,s  . Malnutrition (HCC)   . Sleep apnea    cpap  . Stroke [facility])    02/2013 left side weakness    Patient Active Problem List   Diagnosis Date Noted  . HYPERLIPIDEMIA-MIXED 04/03/2009  . HYPERTENSION, UNSPECIFIED 04/03/2009  . PERICARDITIS, ACUTE 04/03/2009  . SHORTNESS OF BREATH 04/03/2009    Past Surgical History:  Procedure  Laterality Date  . AV FISTULA PLACEMENT Left 12/11/2015   Procedure: ARTERIOVENOUS (AV) FISTULA CREATION ,BRACHIOCEPHALIC;  Surgeon: Annice Needy, MD;  Location: [facility] ORS;  Service: Vascular;  Laterality: Left;  . COLONOSCOPY    . COLONOSCOPY WITH PROPOFOL N/A 05/17/2016   Procedure: COLONOSCOPY WITH PROPOFOL;  Surgeon: Christena Deem, MD;  Location: [facility] ENDOSCOPY;  Service: Endoscopy;  Laterality: N/A;  . INSERTION OF DIALYSIS CATHETER Right    chest  . LITHOTRIPSY    . ROTATOR CUFF REPAIR     Left shoulder, 2 times  . TONSILLECTOMY      Prior to Admission medications   Medication Sig Start Date End Date Taking? Authorizing Provider  amLODipine (NORVASC) 5 MG tablet Take 10 mg by mouth daily.    [provider]  aspirin 81 MG EC tablet Take 81 mg by mouth daily.      [provider]  buPROPion (WELLBUTRIN SR) 150 MG 12 hr tablet Take 150 mg by mouth daily.    [provider]  carvedilol (COREG) 12.5 MG tablet Take 12.5 mg by mouth 2 (two) times daily with a meal.    [provider]  citalopram (CELEXA) 40 MG tablet Take 40 mg by mouth daily.    [provider]  clopidogrel (PLAVIX) 75 MG tablet Take 75 mg by mouth daily.    [provider]  ergocalciferol (VITAMIN D2)  50000 units capsule Take 50,000 Units by mouth.    [provider]  fish oil-omega-3 fatty acids 1000 MG capsule Take 1 capsule by mouth daily.      [provider]  glipiZIDE (GLUCOTROL) 10 MG 24 hr tablet Take 10 mg by mouth daily.      [provider]  hydrALAZINE (APRESOLINE) 25 MG tablet Take 25 mg by mouth 2 (two) times daily.    [provider]  HYDROcodone-acetaminophen (NORCO) 5-325 MG tablet Take 1 tablet by mouth every 6 (six) hours as needed for moderate pain. 12/11/15   Annice Needy, MD  insulin aspart protamine- aspart (NOVOLOG MIX 70/30) (70-30) 100 UNIT/ML injection Inject into the skin as needed. None x 1 month     [provider]  lisinopril (PRINIVIL,ZESTRIL) 20 MG tablet Take 20 mg by mouth daily.      [provider]  ondansetron (ZOFRAN) 4 MG tablet Take 4 mg by mouth every 8 (eight) hours as needed for nausea or vomiting.    [provider]  pantoprazole (PROTONIX) 40 MG tablet Take 40 mg by mouth daily.    [provider]  potassium chloride SA (K-DUR,KLOR-CON) 20 MEQ tablet Take 20 mEq by mouth daily. Only as needed    [provider]  rosuvastatin (CRESTOR) 10 MG tablet Take 10 mg by mouth daily.    [provider]  torsemide (DEMADEX) 20 MG tablet Take 20 mg by mouth 3 (three) times daily.    [provider]  traMADol (ULTRAM) 50 MG tablet Take by mouth every 6 (six) hours as needed.    [provider]    No Known Allergies  Family History  Problem Relation Age of Onset  . Heart failure Mother   . Ulcers Mother        Venous ulcers  . Heart failure Father   . Heart attack Brother        3 heart attacks    Social History Social History   Tobacco Use  . Smoking status: Former Smoker    Types: Cigarettes    Last attempt to quit: 09/20/1972    Years since quitting: 45.3  . Smokeless tobacco: Never Used  . Tobacco comment: Quit at age 81  Substance Use Topics  . Alcohol use: No  . Drug use: No    Review of Systems Constitutional: Negative for fever. Eyes: Negative for visual complaints ENT: Negative for recent illness/congestion Cardiovascular: Negative for chest pain. Respiratory: Mild shortness of breath worse with exertion.  Positive for cough times 2-3 weeks. Gastrointestinal: Negative for abdominal pain, vomiting and diarrhea. Genitourinary: Negative for urinary compaints Musculoskeletal: Mild lower extremity edema worse over the past 2-3 days. Skin: Negative for skin complaints  Neurological: Negative for headache All other ROS negative  ____________________________________________   PHYSICAL  EXAM:  VITAL SIGNS: ED Triage Vitals [01/06/18 2014]  Enc Vitals Group     BP (!) 146/114     Pulse Rate 64     Resp (!) 24     Temp 98.1 F (36.7 C)     Temp Source Oral     SpO2 98 %     Weight 182 lb (82.6 kg)     Height      Head Circumference      Peak Flow      Pain Score      Pain Loc      Pain Edu?      Excl. in  GC?     Constitutional: Alert and oriented. Well appearing and in no distress. Eyes: Normal exam ENT   Head: Normocephalic and atraumatic.   Mouth/Throat: Mucous membranes are moist. Cardiovascular: Normal rate, regular rhythm.  Respiratory: Mild tachypnea.  Frequent cough during examination with mild rhonchi bilaterally. Gastrointestinal: Soft and nontender. No distention.   Musculoskeletal: Nontender with normal range of motion in all extremities.  Mild lower extremity edema, nontender. Neurologic:  Normal speech and language. No gross focal neurologic deficits  Skin:  Skin is warm, dry and intact.  Psychiatric: Mood and affect are normal. Speech and behavior are normal.   ____________________________________________    EKG  EKG reviewed and interpreted by myself shows sinus rhythm at 68 bpm with a narrow QRS, left axis deviation with a prolonged PR interval consistent with first-degree AV block, otherwise normal intervals, no concerning ST changes.  ____________________________________________    RADIOLOGY  X-ray shows a right-sided pleural effusion.  Bibasilar atelectasis.  ____________________________________________   INITIAL IMPRESSION / ASSESSMENT AND PLAN / ED COURSE  Pertinent labs & imaging results that were available during my care of the patient were reviewed by me and considered in my medical decision making (see chart for details).  Patient presents emergency department with shortness of breath and cough.  Differential would include fluid overload from missed dialysis session, CHF, pulmonary edema, pneumonia, URI.  We will  check labs, chest x-ray, closely monitor.  Patient agreeable to plan of care.  No distress at this time.  X-ray shows cardiac enlargement with right pleural effusion.  Labs have resulted largely at baseline with the exception of moderate hyperkalemia.  As the patient missed his dialysis appointment short of breath with a 94% room air saturation currently I discussed the patient with Dr. Cherylann RatelLateef who recommends admission to the hospital for dialysis first thing in the morning.  ____________________________________________   FINAL CLINICAL IMPRESSION(S) / ED DIAGNOSES  Dyspnea Hyperkalemia   Minna AntisPaduchowski, Nastassia Bazaldua, MD 01/06/18 2227

## 2018-01-07 ENCOUNTER — Other Ambulatory Visit: Payer: Self-pay

## 2018-01-07 LAB — CBC
HCT: 34.8 % — ABNORMAL LOW (ref 40.0–52.0)
Hemoglobin: 11.8 g/dL — ABNORMAL LOW (ref 13.0–18.0)
MCH: 33.5 pg (ref 26.0–34.0)
MCHC: 33.9 g/dL (ref 32.0–36.0)
MCV: 98.6 fL (ref 80.0–100.0)
PLATELETS: 153 10*3/uL (ref 150–440)
RBC: 3.53 MIL/uL — ABNORMAL LOW (ref 4.40–5.90)
RDW: 15.5 % — AB (ref 11.5–14.5)
WBC: 8 10*3/uL (ref 3.8–10.6)

## 2018-01-07 LAB — BASIC METABOLIC PANEL
Anion gap: 14 (ref 5–15)
BUN: 97 mg/dL — AB (ref 6–20)
CALCIUM: 9 mg/dL (ref 8.9–10.3)
CHLORIDE: 96 mmol/L — AB (ref 101–111)
CO2: 22 mmol/L (ref 22–32)
CREATININE: 5.46 mg/dL — AB (ref 0.61–1.24)
GFR calc Af Amer: 11 mL/min — ABNORMAL LOW (ref >60)
GFR calc non Af Amer: 10 mL/min — ABNORMAL LOW (ref >60)
GLUCOSE: 80 mg/dL (ref 65–99)
Potassium: 5.3 mmol/L — ABNORMAL HIGH (ref 3.5–5.1)
Sodium: 132 mmol/L — ABNORMAL LOW (ref 135–145)

## 2018-01-07 LAB — GLUCOSE, CAPILLARY: GLUCOSE-CAPILLARY: 83 mg/dL (ref 65–99)

## 2018-01-07 LAB — TROPONIN I
TROPONIN I: 0.04 ng/mL — AB (ref <0.03)
Troponin I: 0.03 ng/mL (ref <0.03)
Troponin I: 0.03 ng/mL (ref <0.03)

## 2018-01-07 MED ORDER — HEPARIN SODIUM (PORCINE) 5000 UNIT/ML IJ SOLN
5000.0000 [IU] | Freq: Three times a day (TID) | INTRAMUSCULAR | Status: DC
  Administered 2018-01-07 – 2018-01-08 (×4): 5000 [IU] via SUBCUTANEOUS
  Filled 2018-01-07 (×4): qty 1

## 2018-01-07 MED ORDER — SODIUM CHLORIDE 0.9 % IV SOLN: 100.0000 mL | INTRAVENOUS | Status: DC | PRN

## 2018-01-07 MED ORDER — FUROSEMIDE 10 MG/ML IJ SOLN
60.0000 mg | Freq: Once | INTRAMUSCULAR | Status: AC
  Administered 2018-01-07: 60 mg via INTRAVENOUS
  Filled 2018-01-07: qty 6

## 2018-01-07 MED ORDER — ROSUVASTATIN CALCIUM 10 MG PO TABS
10.0000 mg | ORAL_TABLET | Freq: Every day | ORAL | Status: DC
  Administered 2018-01-07 – 2018-01-08 (×2): 10 mg via ORAL
  Filled 2018-01-07 (×2): qty 1

## 2018-01-07 MED ORDER — GUAIFENESIN-DM 100-10 MG/5ML PO SYRP
5.0000 mL | ORAL_SOLUTION | ORAL | Status: DC | PRN
  Administered 2018-01-07 (×2): 5 mL via ORAL
  Filled 2018-01-07 (×2): qty 5

## 2018-01-07 MED ORDER — ALTEPLASE 2 MG IJ SOLR: 2.0000 mg | Freq: Once | INTRAMUSCULAR | Status: DC | PRN

## 2018-01-07 MED ORDER — CITALOPRAM HYDROBROMIDE 20 MG PO TABS
40.0000 mg | ORAL_TABLET | Freq: Every day | ORAL | Status: DC
  Administered 2018-01-07 – 2018-01-08 (×2): 40 mg via ORAL
  Filled 2018-01-07 (×2): qty 2

## 2018-01-07 MED ORDER — PANTOPRAZOLE SODIUM 40 MG PO TBEC
40.0000 mg | DELAYED_RELEASE_TABLET | Freq: Every day | ORAL | Status: DC
  Administered 2018-01-07 – 2018-01-08 (×2): 40 mg via ORAL
  Filled 2018-01-07 (×2): qty 1

## 2018-01-07 MED ORDER — ONDANSETRON HCL 4 MG/2ML IJ SOLN: 4.0000 mg | Freq: Four times a day (QID) | INTRAMUSCULAR | Status: DC | PRN

## 2018-01-07 MED ORDER — LISINOPRIL 10 MG PO TABS
10.0000 mg | ORAL_TABLET | Freq: Every day | ORAL | Status: DC
  Administered 2018-01-07 – 2018-01-08 (×2): 10 mg via ORAL
  Filled 2018-01-07 (×2): qty 1

## 2018-01-07 MED ORDER — TAMSULOSIN HCL 0.4 MG PO CAPS
0.4000 mg | ORAL_CAPSULE | Freq: Every day | ORAL | Status: DC
  Administered 2018-01-07 – 2018-01-08 (×2): 0.4 mg via ORAL
  Filled 2018-01-07 (×2): qty 1

## 2018-01-07 MED ORDER — CALCITRIOL 0.25 MCG PO CAPS: 0.2500 ug | ORAL_CAPSULE | ORAL | Status: DC

## 2018-01-07 MED ORDER — OXYCODONE HCL 5 MG PO TABS: 5.0000 mg | ORAL_TABLET | ORAL | Status: DC | PRN

## 2018-01-07 MED ORDER — FAMOTIDINE 20 MG PO TABS
20.0000 mg | ORAL_TABLET | Freq: Two times a day (BID) | ORAL | Status: DC
  Administered 2018-01-07 – 2018-01-08 (×2): 20 mg via ORAL
  Filled 2018-01-07 (×2): qty 1

## 2018-01-07 MED ORDER — ACETAMINOPHEN 650 MG RE SUPP: 650.0000 mg | Freq: Four times a day (QID) | RECTAL | Status: DC | PRN

## 2018-01-07 MED ORDER — AMLODIPINE BESYLATE 5 MG PO TABS
5.0000 mg | ORAL_TABLET | ORAL | Status: DC
  Administered 2018-01-07 – 2018-01-08 (×2): 5 mg via ORAL
  Filled 2018-01-07 (×2): qty 1

## 2018-01-07 MED ORDER — LIDOCAINE HCL (PF) 1 % IJ SOLN
5.0000 mL | INTRAMUSCULAR | Status: DC | PRN
  Filled 2018-01-07: qty 5

## 2018-01-07 MED ORDER — PENTAFLUOROPROP-TETRAFLUOROETH EX AERO
1.0000 "application " | INHALATION_SPRAY | CUTANEOUS | Status: DC | PRN
  Filled 2018-01-07: qty 30

## 2018-01-07 MED ORDER — CARVEDILOL 25 MG PO TABS
25.0000 mg | ORAL_TABLET | Freq: Two times a day (BID) | ORAL | Status: DC
  Administered 2018-01-07 – 2018-01-08 (×2): 25 mg via ORAL
  Filled 2018-01-07 (×2): qty 1

## 2018-01-07 MED ORDER — LIDOCAINE-PRILOCAINE 2.5-2.5 % EX CREA
1.0000 "application " | TOPICAL_CREAM | CUTANEOUS | Status: DC | PRN
  Filled 2018-01-07: qty 5

## 2018-01-07 MED ORDER — SEVELAMER CARBONATE 800 MG PO TABS
800.0000 mg | ORAL_TABLET | Freq: Three times a day (TID) | ORAL | Status: DC
  Administered 2018-01-07 – 2018-01-08 (×4): 800 mg via ORAL
  Filled 2018-01-07 (×4): qty 1

## 2018-01-07 MED ORDER — ACETAMINOPHEN 325 MG PO TABS: 650.0000 mg | ORAL_TABLET | Freq: Four times a day (QID) | ORAL | Status: DC | PRN

## 2018-01-07 MED ORDER — HYDROCOD POLST-CPM POLST ER 10-8 MG/5ML PO SUER
5.0000 mL | Freq: Two times a day (BID) | ORAL | Status: DC | PRN
  Administered 2018-01-07: 5 mL via ORAL
  Filled 2018-01-07: qty 5

## 2018-01-07 MED ORDER — ONDANSETRON HCL 4 MG PO TABS: 4.0000 mg | ORAL_TABLET | Freq: Four times a day (QID) | ORAL | Status: DC | PRN

## 2018-01-07 MED ORDER — BUPROPION HCL ER (XL) 150 MG PO TB24
150.0000 mg | ORAL_TABLET | Freq: Every day | ORAL | Status: DC
  Administered 2018-01-07 – 2018-01-08 (×2): 150 mg via ORAL
  Filled 2018-01-07 (×2): qty 1

## 2018-01-07 MED ORDER — ASPIRIN EC 81 MG PO TBEC
81.0000 mg | DELAYED_RELEASE_TABLET | Freq: Every day | ORAL | Status: DC
  Administered 2018-01-07 – 2018-01-08 (×2): 81 mg via ORAL
  Filled 2018-01-07 (×2): qty 1

## 2018-01-07 MED ORDER — BENZONATATE 100 MG PO CAPS
200.0000 mg | ORAL_CAPSULE | Freq: Three times a day (TID) | ORAL | Status: DC | PRN
  Administered 2018-01-07 – 2018-01-08 (×3): 200 mg via ORAL
  Filled 2018-01-07 (×3): qty 2

## 2018-01-07 NOTE — ED Notes (Signed)
Patient transported to room 253 by this EDT.

## 2018-01-07 NOTE — Progress Notes (Signed)
PT Cancellation Note  Patient Details Name: Nathan Freeman MRN: [id number] DOB: 10/06/1950   Cancelled Treatment:    Reason Eval/Treat Not Completed: Patient at procedure or test/unavailable(pt currently off the floor for HD).  Will attempt to see pt next date (4/21).   Encarnacion ChuAshley Faaris Arizpe PT, DPT 01/07/2018, 12:41 PM

## 2018-01-07 NOTE — Progress Notes (Signed)
This note also relates to the following rows which could not be included: Pulse Rate - Cannot attach notes to unvalidated device data Resp - Cannot attach notes to unvalidated device data BP - Cannot attach notes to unvalidated device data SpO2 - Cannot attach notes to unvalidated device data  HD STARTED  

## 2018-01-07 NOTE — Plan of Care (Signed)
Patient had HD treatment today, 3L removed, OOB to recliner with PT. Poor appetite, nepro carb steady given to patient to supplement, PRN tussionex given for dry non-productive cough. Spiritual consult ordered as patient recently lost his wife, emotional support given. Will continue to assess and monitor.

## 2018-01-07 NOTE — Care Management Obs Status (Signed)
MEDICARE OBSERVATION STATUS NOTIFICATION   Patient Details  Name: Nathan Freeman MRN: [id number] Date of Birth: 09/18/1951   Medicare Observation Status Notification Given:  Yes    Nathan GreetBrenda S Izzabelle Bouley, RN 01/07/2018, 12:47 PM

## 2018-01-07 NOTE — Progress Notes (Signed)
This note also relates to the following rows which could not be included: Pulse Rate - Cannot attach notes to unvalidated device data Resp - Cannot attach notes to unvalidated device data BP - Cannot attach notes to unvalidated device data SpO2 - Cannot attach notes to unvalidated device data  Hd completed  

## 2018-01-07 NOTE — Evaluation (Signed)
Physical Therapy Evaluation Patient Details Name: Nathan HartshornGerry W Freeman MRN: [id number] DOB: 08/14/1951 Today's Date: 01/07/2018   History of Present Illness  Pt is a 67 y/o M who presented with SOB and LE swelling.  Pt missed a dialysis session earlier in the week.  Pt with fluid overload and heart failure exacerbation.  Pt's PMH includes DDD lumbar spine, CVA with L sided weakness, hepatitis, L shoulder RTC repair.      Clinical Impression  Pt admitted with above diagnosis. Pt currently with functional limitations due to the deficits listed below (see PT Problem List). [name] presents with baseline L sided weakness due to h/o stroke and strength RUE and RLE grossly 4/5.  Pt reports he has been using SPC with several falls over the past 6 months, despite his MD instructing pt to use rollator.  Pt lives with two grandsons and will have 24/7 assist/supervision available from them at d/c.  He currently requires close min guard assist for transfers from an elevated bed and when ambulating in the hallway with RW.  Pt will benefit from skilled PT to increase their independence and safety with mobility to allow discharge to the venue listed below.      Follow Up Recommendations Home health PT;Supervision/Assistance - 24 hour    Equipment Recommendations  None recommended by PT    Recommendations for Other Services       Precautions / Restrictions Precautions Precautions: Fall;Other (comment) Precaution Comments: pt's wife passed away earlier this week, she had ALS Restrictions Weight Bearing Restrictions: No      Mobility  Bed Mobility Overal bed mobility: Needs Assistance Bed Mobility: Supine to Sit     Supine to sit: Min guard;HOB elevated     General bed mobility comments: Pt uses bed rail to assist in pulling up to sitting.  Requires increased time and effort.   Transfers Overall transfer level: Needs assistance Equipment used: Rolling walker (2 wheeled) Transfers: Sit to/from  Stand Sit to Stand: Min guard;From elevated surface         General transfer comment: Bed elevated as pt has difficulty rising from regular heigh bed.  Close min guard assist as pt demonstrates unsteadiness but no LOB.    Ambulation/Gait Ambulation/Gait assistance: Min guard Ambulation Distance (Feet): 80 Feet Assistive device: Rolling walker (2 wheeled) Gait Pattern/deviations: Decreased step length - left;Decreased step length - right;Antalgic;Trunk flexed Gait velocity: decreased   General Gait Details: Dec stance time on LLE due to limited L knee E.  Pt unsteady but no LOB, close min guard assist required.    Stairs            Wheelchair Mobility    Modified Rankin (Stroke Patients Only)       Balance Overall balance assessment: Needs assistance;History of Falls Sitting-balance support: No upper extremity supported;Feet supported Sitting balance-Leahy Scale: Good     Standing balance support: Bilateral upper extremity supported;During functional activity Standing balance-Leahy Scale: Poor Standing balance comment: Relies on UE support for static and dynamic activities                             Pertinent Vitals/Pain Pain Assessment: No/denies pain    Home Living Family/patient expects to be discharged to:: Private residence Living Arrangements: Other relatives(20 and 66 y/o grandsons) Available Help at Discharge: Family;Available 24 hours/day Type of Home: House Home Access: Stairs to enter Entrance Stairs-Rails: Right;Left;Can reach both(although pt says they are wobbly)  Entrance Stairs-Number of Steps: 3 Home Layout: One level Home Equipment: Wheelchair - [employer] - 4 wheels;Cane - single point;Bedside commode;Grab bars - tub/shower;Shower seat      Prior Function Level of Independence: Needs assistance   Gait / Transfers Assistance Needed: Pt ambulates with SPC but his doctor has told him to start using RW at all times.  Pt has had 2  falls in the past 6 months.   ADL's / Homemaking Assistance Needed: Pt has been requiring assist with bed mobility, getting dressed.  Pt independent with showering.  Pt no longer driving since 3 weeks ago because pt began feeling weak.    Comments: Pt goes to [facility] 3 days/wk for the whole day.      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  Shoulder F limited to ~60 deg.  Strength grossly 3-/5    Lower Extremity Assessment Lower Extremity Assessment: RLE deficits/detail;LLE deficits/detail RLE Deficits / Details: Strength grossly 4/5 LLE Deficits / Details: L knee lacking ~30 deg E, DF lacking ~5 deg from neutral.  Strength grossly 3/5       Communication   Communication: No difficulties  Cognition Arousal/Alertness: Awake/alert Behavior During Therapy: WFL for tasks assessed/performed Overall Cognitive Status: Within Functional Limits for tasks assessed                                        General Comments      Exercises General Exercises - Lower Extremity Ankle Circles/Pumps: AROM;Both;10 reps;Supine Heel Slides: AROM;Both;10 reps;Supine   Assessment/Plan    PT Assessment Patient needs continued PT services  PT Problem List Decreased strength;Decreased balance;Decreased activity tolerance;Decreased range of motion;Decreased knowledge of use of DME;Decreased safety awareness       PT Treatment Interventions DME instruction;Gait training;Stair training;Functional mobility training;Therapeutic activities;Therapeutic exercise;Balance training;Neuromuscular re-education;Patient/family education;Wheelchair mobility training    PT Goals (Current goals can be found in the  Care Plan section)  Acute Rehab PT Goals Patient Stated Goal: to return to PLOF, improve balance and strength PT Goal Formulation: With patient Time For Goal Achievement: 01/21/18 Potential to Achieve Goals: Good    Frequency Min 2X/week   Barriers to discharge        Co-evaluation               AM-PAC PT "6 Clicks" Daily Activity  Outcome Measure Difficulty turning over in bed (including adjusting bedclothes, sheets and blankets)?: Unable Difficulty moving from lying on back to sitting on the side of the bed? : Unable Difficulty sitting down on and standing up from a chair with arms (e.g., wheelchair, bedside commode, etc,.)?: Unable Help needed moving to and from a bed to chair (including a wheelchair)?: A Little Help needed walking in hospital room?: A Little Help needed climbing 3-5 steps with a railing? : A Lot 6 Click Score: 11    End of Session Equipment Utilized During Treatment: Gait belt Activity Tolerance: Patient tolerated treatment well Patient left: in chair;with call bell/phone within reach;with chair alarm set;with SCD's reapplied Nurse Communication: Mobility status PT Visit Diagnosis: Unsteadiness on feet (R26.81);Other abnormalities of gait and mobility (R26.89);Muscle weakness (generalized) (M62.81);Difficulty in walking,  not elsewhere classified (R26.2)    Time: 1610-9604 PT Time Calculation (min) (ACUTE ONLY): 27 min   Charges:   PT Evaluation $PT Eval Low Complexity: 1 Low PT Treatments $Gait Training: 8-22 mins   PT G Codes:        Encarnacion Chu PT, DPT 01/07/2018, 4:02 PM

## 2018-01-07 NOTE — Progress Notes (Signed)
[employer] - [place] [facility]   PATIENT NAME: Nathan RobertGerry Freeman    MR#:  [id number]  DATE OF BIRTH:  06/12/1951  SUBJECTIVE:  CHIEF COMPLAINT:   Chief Complaint  Patient presents with  . Shortness of Breath   Patient still has shortness of breath and cough.  Seen during hemodialysis.  Missed hemodialysis recently.  REVIEW OF SYSTEMS:    Review of Systems  Constitutional: Positive for malaise/fatigue. Negative for chills and fever.  HENT: Negative for sore throat.   Eyes: Negative for blurred vision, double vision and pain.  Respiratory: Positive for cough and shortness of breath. Negative for hemoptysis.   Cardiovascular: Negative for chest pain, palpitations, orthopnea and leg swelling.  Gastrointestinal: Negative for abdominal pain, constipation, diarrhea, heartburn, nausea and vomiting.  Genitourinary: Negative for dysuria and hematuria.  Musculoskeletal: Negative for back pain and joint pain.  Skin: Negative for rash.  Neurological: Negative for sensory change, speech change, focal weakness and headaches.  Endo/Heme/Allergies: Does not bruise/bleed easily.  Psychiatric/Behavioral: Negative for depression. The patient is not nervous/anxious.     DRUG ALLERGIES:  No Known Allergies  VITALS:  Blood pressure (!) 155/72, pulse 74, temperature 98.4 F (36.9 C), temperature source Oral, resp. rate 20, height 6\' 1"  (1.854 m), weight 89 kg (196 lb 3.4 oz), SpO2 95 %.  PHYSICAL EXAMINATION:   Physical Exam  GENERAL:  67 y.o.-year-old patient lying in the bed with no acute distress.  EYES: Pupils equal, round, reactive to light and accommodation. No scleral icterus. Extraocular muscles intact.  HEENT: Head atraumatic, normocephalic. Oropharynx and nasopharynx clear.  NECK:  Supple, no jugular venous distention. No thyroid enlargement, no tenderness.  LUNGS: Bilateral crackles. CARDIOVASCULAR: S1, S2 normal. No murmurs, rubs, or gallops.  ABDOMEN: Soft,  nontender, nondistended. Bowel sounds present. No organomegaly or mass.  EXTREMITIES: No cyanosis, clubbing or edema b/l.    NEUROLOGIC: Cranial nerves II through XII are intact. No focal Motor or sensory deficits b/l.   PSYCHIATRIC: The patient is alert and oriented x 3.  SKIN: No obvious rash, lesion, or ulcer.   LABORATORY PANEL:   CBC Recent Labs  Lab 01/07/18 0145  WBC 8.0  HGB 11.8*  HCT 34.8*  PLT 153   ------------------------------------------------------------------------------------------------------------------ Chemistries  Recent Labs  Lab 01/06/18 2032 01/07/18 0145  NA 132* 132*  K 5.6* 5.3*  CL 96* 96*  CO2 24 22  GLUCOSE 87 80  BUN 94* 97*  CREATININE 5.50* 5.46*  CALCIUM 9.1 9.0  AST 24  --   ALT 33  --   ALKPHOS 84  --   BILITOT 1.2  --    ------------------------------------------------------------------------------------------------------------------  Cardiac Enzymes Recent Labs  Lab 01/07/18 1158  TROPONINI 0.03*   ------------------------------------------------------------------------------------------------------------------  RADIOLOGY:  Dg Chest 2 View  Result Date: 01/06/2018 CLINICAL DATA:  Shortness of breath EXAM: CHEST - 2 VIEW COMPARISON:  04/11/2009 FINDINGS: Cardiac shadow is enlarged. Right basilar atelectasis and small effusion is noted. Mild left basilar atelectasis is noted as well. No bony abnormality is seen. IMPRESSION: Bibasilar atelectatic changes and right-sided pleural effusion. Electronically Signed   By: Alcide CleverMark  Lukens M.D.   On: 01/06/2018 20:44     ASSESSMENT AND PLAN:   *Acute on chronic diastolic congestive heart failure in setting of end-stage renal disease. Patient missed hemodialysis recently that caused fluid overload.  Plan is for -3 L with hemodialysis today.  *  HTN (hypertension) -continue home meds, blood pressure goal less than 160/100.   *  ESRD on dialysis [facility]) - nephrology consult for dialysis as  above.   * HLD (hyperlipidemia) -continue home meds  All the records are reviewed and case discussed with Care Management/Social Workerr. Management plans discussed with the patient, family and they are in agreement.  CODE STATUS: FULL CODE  DVT Prophylaxis: SCDs  TOTAL TIME TAKING CARE OF THIS PATIENT: 40 minutes.   POSSIBLE D/C IN 1-2 DAYS, DEPENDING ON CLINICAL CONDITION.  Molinda Bailiff Sherald Balbuena M.D on 01/07/2018 at 12:59 PM  Between 7am to 6pm - Pager - [phone]  After 6pm go to [email or web address] - password [email or web address] [facility]  [facility]  Office  [phone]  CC: Primary care physician; [employer]  Note: This dictation was prepared with Nurse, children's dictation along with smaller Lobbyist. Any transcriptional errors that result from this process are unintentional.

## 2018-01-08 MED ORDER — IPRATROPIUM-ALBUTEROL 0.5-2.5 (3) MG/3ML IN SOLN
RESPIRATORY_TRACT | Status: AC
  Administered 2018-01-08: 3 mL
  Filled 2018-01-08: qty 3

## 2018-01-08 MED ORDER — PREDNISONE 10 MG (21) PO TBPK: ORAL_TABLET | ORAL | 0 refills | Status: DC

## 2018-01-08 MED ORDER — ALBUTEROL SULFATE (2.5 MG/3ML) 0.083% IN NEBU
INHALATION_SOLUTION | RESPIRATORY_TRACT | Status: AC
  Filled 2018-01-08: qty 3

## 2018-01-08 MED ORDER — METHYLPREDNISOLONE SODIUM SUCC 125 MG IJ SOLR
60.0000 mg | INTRAMUSCULAR | Status: DC
  Administered 2018-01-08: 60 mg via INTRAVENOUS
  Filled 2018-01-08: qty 2

## 2018-01-08 MED ORDER — HYDROCOD POLST-CPM POLST ER 10-8 MG/5ML PO SUER: 5.0000 mL | Freq: Two times a day (BID) | ORAL | 0 refills | Status: AC | PRN

## 2018-01-08 MED ORDER — IPRATROPIUM-ALBUTEROL 0.5-2.5 (3) MG/3ML IN SOLN
RESPIRATORY_TRACT | Status: AC
  Filled 2018-01-08: qty 3

## 2018-01-08 MED ORDER — IPRATROPIUM-ALBUTEROL 0.5-2.5 (3) MG/3ML IN SOLN
3.0000 mL | Freq: Once | RESPIRATORY_TRACT | Status: AC
  Administered 2018-01-08: 3 mL via RESPIRATORY_TRACT

## 2018-01-08 NOTE — Progress Notes (Signed)
SATURATION QUALIFICATIONS: (This note is used to comply with regulatory documentation for home oxygen)  Patient Saturations on Room Air at Rest = 94%  Patient Saturations on Room Air while Ambulating = 92%  Patient Saturations on n/a Liters of oxygen while Ambulating = n/a  Please briefly explain why patient needs home oxygen: 

## 2018-01-08 NOTE — Progress Notes (Signed)
IV and tele removed from patient. Discharge instructions given to patient and daughter along with hard copy prescriptions. Verbalized understanding. Family to transport patient home.

## 2018-01-08 NOTE — Consult Note (Signed)
[facility] CONSULT NOTE    Date: 01/08/2018                  Patient Name:  Nathan Freeman  MRN: [id number]  DOB: 1951-04-02  Age / Sex: 67 y.o., male         PCP: [facility]                 Service Requesting Consult: Hospitalist                 Reason for Consult: Shortness of breath in the setting of ESRD            History of Present Illness: Patient is a 67 y.o. male with a PMHx of pericarditis, anemia, ESRD on HD TTS followed by [facility] nephrology, diabetes mellitus type 2, hyperlipidemia, secondary hyperparathyroidism, anemia of chronic kidney disease, hypertension, malnutrition, obstructive sleep apnea, history of CVA with left-sided weakness who was admitted to [facility] on 01/06/2018 for evaluation of shortness of breath.  The patient missed a dialysis session earlier this week after his wife had passed away.  Patient did undergo hemodialysis yesterday and he is feeling somewhat better.  His shortness of breath has improved but he still has a mild cough.  Yesterday with dialysis 3 kg of ultrafiltration was performed.  He has associated anemia of chronic kidney disease with a hemoglobin 11.8.   Medications: Outpatient medications: Medications Prior to Admission  Medication Sig Dispense Refill Last Dose  . acetaminophen (TYLENOL) 650 MG CR tablet Take 1,300 mg by mouth 2 (two) times daily as needed for pain (back pain).   unknown at unknown  . albuterol (PROVENTIL) (2.5 MG/3ML) 0.083% nebulizer solution Take 2.5 mg by nebulization every 6 (six) hours as needed for wheezing or shortness of breath.   unknown at unknown  . amLODipine (NORVASC) 5 MG tablet Take 5 mg by mouth every Tuesday, Thursday, Saturday, and Sunday.    unknown at unknown  . aspirin 81 MG EC tablet Take 81 mg by mouth daily.     unknown at unknown  . buPROPion (WELLBUTRIN XL) 150 MG 24 hr tablet Take 150 mg by mouth daily.   unknown at unknown  . calcitRIOL (ROCALTROL)  0.25 MCG capsule Take 0.25 mcg by mouth 3 (three) times a week. On dialysis days   unknown at unknown  . carvedilol (COREG) 25 MG tablet Take 25 mg by mouth 2 (two) times daily with a meal.   unknown at unknown  . citalopram (CELEXA) 40 MG tablet Take 40 mg by mouth daily.   unknown at unknown  . docusate sodium (COLACE) 100 MG capsule Take 100 mg by mouth daily as needed for mild constipation.   unknown at unknown  . fish oil-omega-3 fatty acids 1000 MG capsule Take 2 capsules by mouth daily with supper.    unknown at unknown  . guaiFENesin (MUCINEX) 600 MG 12 hr tablet Take 600 mg by mouth 2 (two) times daily as needed for cough.   unknown at unknown  . lisinopril (PRINIVIL,ZESTRIL) 10 MG tablet Take 10 mg by mouth daily.   unknown at unknown  . pantoprazole (PROTONIX) 40 MG tablet Take 40 mg by mouth daily.   unknown at unknown  . polycarbophil (FIBERCON) 625 MG tablet Take 625 mg by mouth 2 (two) times daily.   unknown at unknown  . ranitidine (ZANTAC) 150 MG tablet Take 150 mg by mouth 2 (two) times daily.  unknown at unknown  . rosuvastatin (CRESTOR) 10 MG tablet Take 10 mg by mouth daily.   unknown at unknown  . sevelamer carbonate (RENVELA) 800 MG tablet Take 800 mg by mouth 3 (three) times daily with meals.   unknown at unknown  . tamsulosin (FLOMAX) 0.4 MG CAPS capsule Take 0.4 mg by mouth daily.   unknown at unknown  . traMADol (ULTRAM) 50 MG tablet Take 50 mg by mouth every 6 (six) hours as needed for moderate pain (back pain).    unknown at unknown  . HYDROcodone-acetaminophen (NORCO) 5-325 MG tablet Take 1 tablet by mouth every 6 (six) hours as needed for moderate pain. (Patient not taking: Reported on 01/06/2018) 30 tablet 0 Not Taking at Unknown time    Current medications: Current Facility-Administered Medications  Medication Dose Route Frequency Provider Last Rate Last Dose  . acetaminophen (TYLENOL) tablet 650 mg  650 mg Oral Q6H PRN Oralia Manis, MD       Or  .  acetaminophen (TYLENOL) suppository 650 mg  650 mg Rectal Q6H PRN Oralia Manis, MD      . amLODipine (NORVASC) tablet 5 mg  5 mg Oral Q T,Th,S,Su Oralia Manis, MD   5 mg at 01/08/18 1107  . aspirin EC tablet 81 mg  81 mg Oral Daily Oralia Manis, MD   81 mg at 01/08/18 1107  . benzonatate (TESSALON) capsule 200 mg  200 mg Oral TID PRN Oralia Manis, MD   200 mg at 01/08/18 0841  . buPROPion (WELLBUTRIN XL) 24 hr tablet 150 mg  150 mg Oral Daily Oralia Manis, MD   150 mg at 01/08/18 1107  . [START ON 01/09/2018] calcitRIOL (ROCALTROL) capsule 0.25 mcg  0.25 mcg Oral Once per day on Mon Wed Fri Willis, David, MD      . carvedilol (COREG) tablet 25 mg  25 mg Oral BID WC Oralia Manis, MD   25 mg at 01/08/18 0830  . chlorpheniramine-HYDROcodone (TUSSIONEX) 10-8 MG/5ML suspension 5 mL  5 mL Oral Q12H PRN Milagros Loll, MD   5 mL at 01/07/18 1756  . citalopram (CELEXA) tablet 40 mg  40 mg Oral Daily Oralia Manis, MD   40 mg at 01/08/18 1107  . famotidine (PEPCID) tablet 20 mg  20 mg Oral BID Oralia Manis, MD   20 mg at 01/08/18 1107  . heparin injection 5,000 Units  5,000 Units Subcutaneous Tedra Coupe Oralia Manis, MD   5,000 Units at 01/08/18 [phone]  . ipratropium-albuterol (DUONEB) 0.5-2.5 (3) MG/3ML nebulizer solution           . lisinopril (PRINIVIL,ZESTRIL) tablet 10 mg  10 mg Oral Daily Oralia Manis, MD   10 mg at 01/08/18 1107  . methylPREDNISolone sodium succinate (SOLU-MEDROL) 125 mg/2 mL injection 60 mg  60 mg Intravenous Q24H Milagros Loll, MD   60 mg at 01/08/18 1106  . ondansetron (ZOFRAN) tablet 4 mg  4 mg Oral Q6H PRN Oralia Manis, MD       Or  . ondansetron [facility]) injection 4 mg  4 mg Intravenous Q6H PRN Oralia Manis, MD      . oxyCODONE (Oxy IR/ROXICODONE) immediate release tablet 5 mg  5 mg Oral Q4H PRN Oralia Manis, MD      . pantoprazole (PROTONIX) EC tablet 40 mg  40 mg Oral Daily Oralia Manis, MD   40 mg at 01/08/18 1107  . rosuvastatin (CRESTOR) tablet 10 mg  10 mg Oral Daily  Oralia Manis, MD   10 mg  at 01/08/18 1107  . sevelamer carbonate (RENVELA) tablet 800 mg  800 mg Oral TID WC Oralia ManisWillis, David, MD   800 mg at 01/08/18 1214  . tamsulosin (FLOMAX) capsule 0.4 mg  0.4 mg Oral Daily Oralia ManisWillis, David, MD   0.4 mg at 01/08/18 1108      Allergies: No Known Allergies    Past Medical History: Past Medical History:  Diagnosis Date  . Acute pericarditis    Probable, Echo (7/10) showed mild LVH, normal LV size and systolic function, EF 55-60%, normal wall motion, mild MR, no AS, partially loculated small effusion along the RA free wall  . Anemia   . CHF (congestive heart failure) (HCC)   . DDD (degenerative disc disease), lumbar    Spine  . Depression   . Diabetes mellitus type II    Poor control due to medication noncompliance. Last hgbA1c 13.2  . Edema   . Hepatitis, unspecified   . Hyperlipidemia   . Hyperparathyroidism (HCC)   . Hypertension    Cough with a BP med in teh past, sounds like ACEI  . Kidney stones    dialysis t,t,s  . Malnutrition (HCC)   . Sleep apnea    cpap  . Stroke [facility](HCC)    02/2013 left side weakness     Past Surgical History: Past Surgical History:  Procedure Laterality Date  . AV FISTULA PLACEMENT Left 12/11/2015   Procedure: ARTERIOVENOUS (AV) FISTULA CREATION ,BRACHIOCEPHALIC;  Surgeon: Annice NeedyJason S Dew, MD;  Location: [facility] ORS;  Service: Vascular;  Laterality: Left;  . COLONOSCOPY    . COLONOSCOPY WITH PROPOFOL N/A 05/17/2016   Procedure: COLONOSCOPY WITH PROPOFOL;  Surgeon: Christena DeemMartin U Skulskie, MD;  [facility];  Service: Endoscopy;  Laterality: N/A;  . INSERTION OF DIALYSIS CATHETER Right    chest  . LITHOTRIPSY    . ROTATOR CUFF REPAIR     Left shoulder, 2 times  . TONSILLECTOMY       Family History: Family History  Problem Relation Age of Onset  . Heart failure Mother   . Ulcers Mother        Venous ulcers  . Heart failure Father   . Heart attack Brother        3 heart attacks     Social  History: Social History   Socioeconomic History  . Marital status: Married    Spouse name: Not on file  . Number of children: Not on file  . Years of education: Not on file  . Highest education level: Not on file  Occupational History  . 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 Financial resource strain: Not on file  . Food insecurity:    Worry: Not on file    Inability: Not on file  . Transportation needs:    Medical: Not on file    Non-medical: Not on file  Tobacco Use  . Smoking status: Former Smoker    Types: Cigarettes    Last attempt to quit: 09/20/1972    Years since quitting: 45.3  . Smokeless tobacco: Never Used  . Tobacco comment: Quit at age 67  Substance and Sexual Activity  . Alcohol use: No  . Drug use: No  . Sexual activity: Not on file  Lifestyle  . Physical activity:    Days per week: Not on file    Minutes per session: Not on file  . Stress: Not on file  Relationships  . Social connections:    Talks on  phone: Not on file    Gets together: Not on file    Attends religious service: Not on file    Active member of club or organization: Not on file    Attends meetings of clubs or organizations: Not on file    Relationship status: Not on file  . Intimate partner violence:    Fear of current or ex partner: Not on file    Emotionally abused: Not on file    Physically abused: Not on file    Forced sexual activity: Not on file  Other Topics Concern  . Not on file  Social History Narrative   Married   Does not get regular exercise     Review of Systems: Review of Systems  Constitutional: Negative for chills, fever and malaise/fatigue.  HENT: Negative for congestion, hearing loss and tinnitus.   Eyes: Negative for blurred vision and double vision.  Respiratory: Positive for cough and shortness of breath.   Cardiovascular: Negative for chest pain and palpitations.  Gastrointestinal: Negative for heartburn, nausea and vomiting.   Genitourinary: Negative for dysuria, frequency and urgency.  Musculoskeletal: Negative for falls and myalgias.  Skin: Negative for itching and rash.  Neurological: Negative for dizziness and weakness.  Endo/Heme/Allergies: Negative for polydipsia. Does not bruise/bleed easily.  Psychiatric/Behavioral: Positive for depression.     Vital Signs: Blood pressure (!) 143/74, pulse 71, temperature 97.8 F (36.6 C), temperature source Oral, resp. rate 18, height 6\' 1"  (1.854 m), weight 82.7 kg (182 lb 6.4 oz), SpO2 94 %.  Weight trends: Filed Weights   01/07/18 0315 01/07/18 1045 01/08/18 0343  Weight: 86.5 kg (190 lb 9.6 oz) 89 kg (196 lb 3.4 oz) 82.7 kg (182 lb 6.4 oz)    Physical Exam: General: NAD, sitting up in bed  Head: Normocephalic, atraumatic.  Eyes: Anicteric, EOMI  Nose: Mucous membranes moist, not inflammed, nonerythematous.  Throat: Oropharynx nonerythematous, no exudate appreciated.   Neck: Supple, trachea midline.  Lungs:  Normal respiratory effort.  Scattered rgibcgu  Heart: RRR. S1 and S2 normal without gallop, murmur, or rubs.  Abdomen:  BS normoactive. Soft, Nondistended, non-tender.  No masses or organomegaly.  Extremities: No pretibial edema.  Neurologic: A&O X3, Motor strength is 5/5 in the all 4 extremities  Skin: No visible rashes, scars.    Lab results: Basic Metabolic Panel: Recent Labs  Lab 01/06/18 2032 01/07/18 0145  NA 132* 132*  K 5.6* 5.3*  CL 96* 96*  CO2 24 22  GLUCOSE 87 80  BUN 94* 97*  CREATININE 5.50* 5.46*  CALCIUM 9.1 9.0    Liver Function Tests: Recent Labs  Lab 01/06/18 2032  AST 24  ALT 33  ALKPHOS 84  BILITOT 1.2  PROT 7.1  ALBUMIN 3.7   No results for input(s): LIPASE, AMYLASE in the last 168 hours. No results for input(s): AMMONIA in the last 168 hours.  CBC: Recent Labs  Lab 01/06/18 2032 01/07/18 0145  WBC 9.6 8.0  HGB 12.3* 11.8*  HCT 35.5* 34.8*  MCV 98.2 98.6  PLT 157 153    Cardiac  Enzymes: Recent Labs  Lab 01/06/18 2032 01/07/18 0145 01/07/18 0814 01/07/18 1158  TROPONINI 0.04* 0.04* 0.03* 0.03*    BNP: Invalid input(s): POCBNP  CBG: Recent Labs  Lab 01/07/18 0103  GLUCAP 83    Microbiology: No results found for this or any previous visit.  Coagulation Studies: No results for input(s): LABPROT, INR in the last 72 hours.  Urinalysis: No results for input(s):  COLORURINE, LABSPEC, PHURINE, GLUCOSEU, HGBUR, BILIRUBINUR, KETONESUR, PROTEINUR, UROBILINOGEN, NITRITE, LEUKOCYTESUR in the last 72 hours.  Invalid input(s): APPERANCEUR    Imaging: Dg Chest 2 View  Result Date: 01/06/2018 CLINICAL DATA:  Shortness of breath EXAM: CHEST - 2 VIEW COMPARISON:  04/11/2009 FINDINGS: Cardiac shadow is enlarged. Right basilar atelectasis and small effusion is noted. Mild left basilar atelectasis is noted as well. No bony abnormality is seen. IMPRESSION: Bibasilar atelectatic changes and right-sided pleural effusion. Electronically Signed   By: Alcide Clever M.D.   On: 01/06/2018 20:44      Assessment & Plan: Pt is a 67 y.o. male  with a PMHx of pericarditis, anemia, ESRD on HD TTS followed by [facility] nephrology, diabetes mellitus type 2, hyperlipidemia, secondary hyperparathyroidism, anemia of chronic kidney disease, hypertension, malnutrition, obstructive sleep apnea, history of CVA with left-sided weakness who was admitted to [facility] on 01/06/2018 for evaluation of shortness of breath.  [facility] [facility]  1.  Shortness of breath.  Suspect that volume overload was playing some role yesterday however patient did have hemodialysis yesterday with an ultrafiltration target of 3 kg.  No further need for ultrafiltration at this time.  2.  Anemia of chronic kidney disease.  Hemoglobin currently 11.8.  Patient can resume the point stim leading agents as an outpatient.  3.  Secondary hyperparathyroidism.  She is to be maintained on Renvela 2 mg p.o. 3 times  daily with meals.  Continue to monitor phosphorus as an outpatient.  4.  Hypertension.  Maintain the patient on carvedilol, amlodipine, and lisinopril.  Blood pressure currently acceptable at 143/74.

## 2018-01-08 NOTE — Discharge Instructions (Signed)
Resume diet and activity as before ° ° °

## 2018-01-08 NOTE — Care Management Note (Signed)
Case Management Note  Patient Details  Name: Nathan HartshornGerry W Freeman MRN: [id number] Date of Birth: 12/05/1950  Subjective/Objective:     [facility] with the diagnosis of fluid overload. Lucila MaineGrandson is in the ho,me. [facility]. [name]Saturday since 2017               Action/Plan: Physical therapy evaluation completed. Recommending Home Health PT in the home. Goes to [facility], They will arrange services.  Expected Discharge Date:                  Expected Discharge Plan:     In-House Referral:     Discharge planning Services     Post Acute Care Choice:    Choice offered to:     DME Arranged:    DME Agency:     HH Arranged:    HH Agency:     Status of Service:     If discussed at [employer] Meetings, dates discussed:    Additional Comments:  Gwenette GreetBrenda S Nattaly Yebra, RN MSN CCM Care Management [phone] 01/08/2018, 10:51 AM

## 2018-01-09 ENCOUNTER — Emergency Department: Payer: Medicare (Managed Care)

## 2018-01-09 ENCOUNTER — Other Ambulatory Visit: Payer: Self-pay

## 2018-01-09 ENCOUNTER — Emergency Department
Admission: EM | Admit: 2018-01-09 | Discharge: 2018-01-10 | Disposition: A | Discharge status: Other Acute Inpatient Hospital | Payer: Medicare (Managed Care) | Attending: Emergency Medicine | Admitting: Emergency Medicine

## 2018-01-09 DIAGNOSIS — R600 Localized edema: Secondary | ICD-10-CM | POA: Insufficient documentation

## 2018-01-09 DIAGNOSIS — I5042 Chronic combined systolic (congestive) and diastolic (congestive) heart failure: Secondary | ICD-10-CM | POA: Diagnosis not present

## 2018-01-09 DIAGNOSIS — S066X1A Traumatic subarachnoid hemorrhage with loss of consciousness of 30 minutes or less, initial encounter: Secondary | ICD-10-CM | POA: Diagnosis not present

## 2018-01-09 DIAGNOSIS — N186 End stage renal disease: Secondary | ICD-10-CM | POA: Diagnosis not present

## 2018-01-09 DIAGNOSIS — Z992 Dependence on renal dialysis: Secondary | ICD-10-CM | POA: Diagnosis not present

## 2018-01-09 DIAGNOSIS — W19XXXA Unspecified fall, initial encounter: Secondary | ICD-10-CM | POA: Diagnosis not present

## 2018-01-09 DIAGNOSIS — Z87891 Personal history of nicotine dependence: Secondary | ICD-10-CM | POA: Diagnosis not present

## 2018-01-09 DIAGNOSIS — Y939 Activity, unspecified: Secondary | ICD-10-CM | POA: Diagnosis not present

## 2018-01-09 DIAGNOSIS — Y999 Unspecified external cause status: Secondary | ICD-10-CM | POA: Insufficient documentation

## 2018-01-09 DIAGNOSIS — Y92000 Kitchen of unspecified non-institutional (private) residence as  the place of occurrence of the external cause: Secondary | ICD-10-CM | POA: Diagnosis not present

## 2018-01-09 DIAGNOSIS — R55 Syncope and collapse: Secondary | ICD-10-CM | POA: Diagnosis present

## 2018-01-09 DIAGNOSIS — E1122 Type 2 diabetes mellitus with diabetic chronic kidney disease: Secondary | ICD-10-CM | POA: Diagnosis not present

## 2018-01-09 DIAGNOSIS — S06321A Contusion and laceration of left cerebrum with loss of consciousness of 30 minutes or less, initial encounter: Secondary | ICD-10-CM | POA: Diagnosis not present

## 2018-01-09 DIAGNOSIS — I132 Hypertensive heart and chronic kidney disease with heart failure and with stage 5 chronic kidney disease, or end stage renal disease: Secondary | ICD-10-CM | POA: Insufficient documentation

## 2018-01-09 LAB — COMPREHENSIVE METABOLIC PANEL
ALBUMIN: 3.4 g/dL — AB (ref 3.5–5.0)
ALT: 28 U/L (ref 17–63)
AST: 25 U/L (ref 15–41)
Alkaline Phosphatase: 101 U/L (ref 38–126)
Anion gap: 12 (ref 5–15)
BUN: 84 mg/dL — AB (ref 6–20)
CHLORIDE: 93 mmol/L — AB (ref 101–111)
CO2: 26 mmol/L (ref 22–32)
Calcium: 9.3 mg/dL (ref 8.9–10.3)
Creatinine, Ser: 4.94 mg/dL — ABNORMAL HIGH (ref 0.61–1.24)
GFR calc Af Amer: 13 mL/min — ABNORMAL LOW (ref >60)
GFR calc non Af Amer: 11 mL/min — ABNORMAL LOW (ref >60)
Glucose, Bld: 200 mg/dL — ABNORMAL HIGH (ref 65–99)
POTASSIUM: 3.7 mmol/L (ref 3.5–5.1)
SODIUM: 131 mmol/L — AB (ref 135–145)
Total Bilirubin: 0.9 mg/dL (ref 0.3–1.2)
Total Protein: 6.5 g/dL (ref 6.5–8.1)

## 2018-01-09 LAB — CBC WITH DIFFERENTIAL/PLATELET
BASOS ABS: 0.1 10*3/uL (ref 0–0.1)
BASOS PCT: 1 %
EOS ABS: 0 10*3/uL (ref 0–0.7)
EOS PCT: 0 %
HCT: 35.6 % — ABNORMAL LOW (ref 40.0–52.0)
Hemoglobin: 12.1 g/dL — ABNORMAL LOW (ref 13.0–18.0)
Lymphocytes Relative: 2 %
Lymphs Abs: 0.2 10*3/uL — ABNORMAL LOW (ref 1.0–3.6)
MCH: 33.1 pg (ref 26.0–34.0)
MCHC: 33.9 g/dL (ref 32.0–36.0)
MCV: 97.5 fL (ref 80.0–100.0)
MONO ABS: 0.5 10*3/uL (ref 0.2–1.0)
Monocytes Relative: 5 %
Neutro Abs: 9 10*3/uL — ABNORMAL HIGH (ref 1.4–6.5)
Neutrophils Relative %: 92 %
PLATELETS: 157 10*3/uL (ref 150–440)
RBC: 3.65 MIL/uL — ABNORMAL LOW (ref 4.40–5.90)
RDW: 15.6 % — AB (ref 11.5–14.5)
WBC: 9.8 10*3/uL (ref 3.8–10.6)

## 2018-01-09 LAB — HIV ANTIBODY (ROUTINE TESTING W REFLEX): HIV SCREEN 4TH GENERATION: NONREACTIVE

## 2018-01-09 NOTE — ED Provider Notes (Signed)
[facility] Emergency Department Provider Note   ____________________________________________   First MD Initiated Contact with Patient 01/09/18 2325     (approximate)  I have reviewed the triage vital signs and the nursing notes.   HISTORY  Chief Complaint Loss of Consciousness and Fall    HPI Nathan Freeman is a 67 y.o. male who comes into the hospital today after a syncopal episode.  The patient's family states that he fell tonight and passed out.  The patient does not remember what occurred.  He does not remember exactly what happened most of the day.  The family states that they are also unsure what happened.  The patient was inside of the kitchen by himself.  His grandson heard a loud noise and found him unconscious on the floor in the kitchen.  The patient was admitted to the hospital on Friday and discharged on Sunday after having some shortness of breath and heart failure.  The patient has end-stage renal disease on dialysis and received dialysis in the hospital on Saturday.  He also had fluid drawn off his lungs according to the family.  The patient was out for a few minutes but denies any chest pain or increased shortness of breath.  He states though that he did have some dark appearing urine which was not checked when he was recently seen in the hospital.  The patient is on Tussionex but has not taken his prednisone.  He has had a nonproductive cough.  The patient does have a contusion to his head from his fall.  He denies any pain at this time.  The family states that he has been doing well today.  He is eaten and went and followed up at his pace program today.  He is here for evaluation.   Past Medical History:  Diagnosis Date  . Acute pericarditis    Probable, Echo (7/10) showed mild LVH, normal LV size and systolic function, EF 55-60%, normal wall motion, mild MR, no AS, partially loculated small effusion along the RA free wall  . Anemia   . CHF  (congestive heart failure) (HCC)   . DDD (degenerative disc disease), lumbar    Spine  . Depression   . Diabetes mellitus type II    Poor control due to medication noncompliance. Last hgbA1c 13.2  . Edema   . Hepatitis, unspecified   . Hyperlipidemia   . Hyperparathyroidism (HCC)   . Hypertension    Cough with a BP med in teh past, sounds like ACEI  . Kidney stones    dialysis t,t,s  . Malnutrition (HCC)   . Sleep apnea    cpap  . Stroke [facility](HCC)    02/2013 left side weakness    Patient Active Problem List   Diagnosis Date Noted  . Fluid overload 01/06/2018  . Acute on chronic combined systolic and diastolic CHF (congestive heart failure) (HCC) 01/06/2018  . Diabetes (HCC) 01/06/2018  . ESRD on dialysis (HCC) 01/06/2018  . HLD (hyperlipidemia) 04/03/2009  . HTN (hypertension) 04/03/2009  . PERICARDITIS, ACUTE 04/03/2009  . SHORTNESS OF BREATH 04/03/2009    Past Surgical History:  Procedure Laterality Date  . AV FISTULA PLACEMENT Left 12/11/2015   Procedure: ARTERIOVENOUS (AV) FISTULA CREATION ,BRACHIOCEPHALIC;  Surgeon: Annice NeedyJason S Dew, MD;  Location: [facility] ORS;  Service: Vascular;  Laterality: Left;  . COLONOSCOPY    . COLONOSCOPY WITH PROPOFOL N/A 05/17/2016   Procedure: COLONOSCOPY WITH PROPOFOL;  Surgeon: Christena DeemMartin U Skulskie, MD;  Location:  [facility] ENDOSCOPY;  Service: Endoscopy;  Laterality: N/A;  . INSERTION OF DIALYSIS CATHETER Right    chest  . LITHOTRIPSY    . ROTATOR CUFF REPAIR     Left shoulder, 2 times  . TONSILLECTOMY      Prior to Admission medications   Medication Sig Start Date End Date Taking? Authorizing Provider  acetaminophen (TYLENOL) 650 MG CR tablet Take 1,300 mg by mouth 2 (two) times daily as needed for pain (back pain).   Yes [provider]  albuterol (PROVENTIL) (2.5 MG/3ML) 0.083% nebulizer solution Take 2.5 mg by nebulization every 6 (six) hours as needed for wheezing or shortness of breath.   Yes [provider]  amLODipine  (NORVASC) 5 MG tablet Take 5 mg by mouth every Tuesday, Thursday, Saturday, and Sunday.    Yes [provider]  aspirin 81 MG EC tablet Take 81 mg by mouth daily.     Yes [provider]  buPROPion (WELLBUTRIN XL) 150 MG 24 hr tablet Take 150 mg by mouth daily.   Yes [provider]  calcitRIOL (ROCALTROL) 0.25 MCG capsule Take 0.25 mcg by mouth 3 (three) times a week. On dialysis days   Yes [provider]  carvedilol (COREG) 25 MG tablet Take 25 mg by mouth 2 (two) times daily with a meal.   Yes [provider]  chlorpheniramine-HYDROcodone (TUSSIONEX) 10-8 MG/5ML SUER Take 5 mLs by mouth every 12 (twelve) hours as needed for cough. 01/08/18  Yes Sudini, Wardell Heath, MD  citalopram (CELEXA) 40 MG tablet Take 40 mg by mouth daily.   Yes [provider]  docusate sodium (COLACE) 100 MG capsule Take 100 mg by mouth daily as needed for mild constipation.   Yes [provider]  fish oil-omega-3 fatty acids 1000 MG capsule Take 2 capsules by mouth daily with supper.    Yes [provider]  guaiFENesin (MUCINEX) 600 MG 12 hr tablet Take 600 mg by mouth 2 (two) times daily as needed for cough.   Yes [provider]  lisinopril (PRINIVIL,ZESTRIL) 10 MG tablet Take 10 mg by mouth daily.   Yes [provider]  pantoprazole (PROTONIX) 40 MG tablet Take 40 mg by mouth daily.   Yes [provider]  polycarbophil (FIBERCON) 625 MG tablet Take 625 mg by mouth 2 (two) times daily.   Yes [provider]  ranitidine (ZANTAC) 150 MG tablet Take 150 mg by mouth 2 (two) times daily.   Yes [provider]  rosuvastatin (CRESTOR) 10 MG tablet Take 10 mg by mouth daily.   Yes [provider]  sevelamer carbonate (RENVELA) 800 MG tablet Take 800 mg by mouth 3 (three) times daily with meals.   Yes [provider]  tamsulosin (FLOMAX) 0.4 MG CAPS capsule Take 0.4 mg by mouth daily.   Yes [provider]  traMADol (ULTRAM) 50 MG tablet Take 50 mg by mouth every 6 (six) hours as needed for moderate pain (back pain).    Yes [provider]  HYDROcodone-acetaminophen (NORCO) 5-325 MG tablet Take 1 tablet by mouth every 6 (six) hours as needed for moderate pain. Patient not taking: Reported on 01/06/2018 12/11/15   Annice Needy, MD  predniSONE (STERAPRED UNI-PAK 21 TAB) 10 MG (21) TBPK tablet 6 tabs day 1 and taper 10 mg a day - 6 days Patient not taking: Reported on 01/09/2018 01/08/18   Milagros Loll, MD    Allergies Patient has no known allergies.  Family  History  Problem Relation Age of Onset  . Heart failure Mother   . Ulcers Mother        Venous ulcers  . Heart failure Father   . Heart attack Brother        3 heart attacks    Social History Social History   Tobacco Use  . Smoking status: Former Smoker    Types: Cigarettes    Last attempt to quit: 09/20/1972    Years since quitting: 45.3  . Smokeless tobacco: Never Used  . Tobacco comment: Quit at age 56  Substance Use Topics  . Alcohol use: No  . Drug use: No    Review of Systems  Constitutional: No fever/chills Eyes: No visual changes. ENT: No sore throat. Cardiovascular: Denies chest pain. Respiratory: Cough and mild shortness of breath. Gastrointestinal: No abdominal pain.  No nausea, no vomiting.  No diarrhea.  No constipation. Genitourinary: Dark appearing urine. Musculoskeletal: Negative for back pain. Skin: Negative for rash. Neurological: Syncope   ____________________________________________   PHYSICAL EXAM:  VITAL SIGNS: ED Triage Vitals  Enc Vitals Group     BP 01/09/18 2237 (!) 142/76     Pulse Rate 01/09/18 2237 (!) 58     Resp 01/09/18 2240 (!) 22     Temp 01/09/18 2240 (!) 97.3 F (36.3 C)     Temp Source 01/09/18 2240 Oral     SpO2 01/09/18 2237 97 %     Weight 01/09/18 2241 200 lb (90.7 kg)     Height 01/09/18 2241 6' (1.829 m)     Head Circumference --       Peak Flow --      Pain Score 01/09/18 2241 0     Pain Loc --      Pain Edu? --      Excl. in GC? --     Constitutional: Alert and oriented. Well appearing and in no acute distress. Eyes: Conjunctivae are normal. PERRL. EOMI. Head:  contusion to left side of head with no bruising and no laceration Nose: No congestion/rhinnorhea. Mouth/Throat: Mucous membranes are moist.  Oropharynx non-erythematous. Neck: No cervical spine tenderness to palpation. Cardiovascular: Normal rate, regular rhythm. Grossly normal heart sounds.  Good peripheral circulation. Respiratory: Normal respiratory effort.  No retractions. Lungs CTAB. Gastrointestinal: Soft and nontender. No distention. Positive bowel sounds Musculoskeletal: Bilateral lower extremity pitting edema. Neurologic:  Normal speech and language.  Cranial nerves II through XII are grossly intact with no focal motor neuro deficit Skin:  Skin is warm, dry and intact.  Psychiatric: Mood and affect are normal.   ____________________________________________   LABS (all labs ordered are listed, but only abnormal results are displayed)  Labs Reviewed  CBC WITH DIFFERENTIAL/PLATELET - Abnormal; Notable for the following components:      Result Value   RBC 3.65 (*)    Hemoglobin 12.1 (*)    HCT 35.6 (*)    RDW 15.6 (*)    Neutro Abs 9.0 (*)    Lymphs Abs 0.2 (*)    All other components within normal limits  COMPREHENSIVE METABOLIC PANEL - Abnormal; Notable for the following components:   Sodium 131 (*)    Chloride 93 (*)    Glucose, Bld 200 (*)    BUN 84 (*)    Creatinine, Ser 4.94 (*)    Albumin 3.4 (*)    GFR calc non Af Amer 11 (*)    GFR calc Af Amer 13 (*)    All other components  within normal limits  TROPONIN I - Abnormal; Notable for the following components:   Troponin I 0.04 (*)    All other components within normal limits  BRAIN NATRIURETIC PEPTIDE - Abnormal; Notable for the following components:   B Natriuretic Peptide  >4,500.0 (*)    All other components within normal limits  URINALYSIS, COMPLETE (UACMP) WITH MICROSCOPIC   ____________________________________________  EKG  ED ECG REPORT I, Rebecka Apley, the attending physician, personally viewed and interpreted this ECG.   Date: 01/09/2018  EKG Time: 2242  Rate: 59  Rhythm: normal sinus rhythm  Axis: normal  Intervals:none  ST&T Change: none  ____________________________________________  RADIOLOGY  ED MD interpretation:    CT head and cervical spine: Small focus of left frontal pole hemorrhagic contusion with multifocal subarachnoid hemorrhage in the interhemispheric fissure, left sylvian fissure, left lateral ventricle and both ambient cisterns, blood is most likely of traumatic origin, left parietal scalp hematoma without skull fracture.  Bulky flowing anterior cervical spine osteophytes from C2-T1 consistent with diffuse idiopathic skeletal hyperostosis  CXR: mildly increased interstitial markings may reflect minimal interstitial edema  Official radiology report(s): Ct Angio Head W Or Wo Contrast  Result Date: 01/10/2018 CLINICAL DATA:  Subarachnoid hemorrhage.  Head trauma. EXAM: CT ANGIOGRAPHY HEAD TECHNIQUE: Multidetector CT imaging of the head was performed using the standard protocol during bolus administration of intravenous contrast. Multiplanar CT image reconstructions and MIPs were obtained to evaluate the vascular anatomy. CONTRAST:  75mL OMNIPAQUE IOHEXOL 350 MG/ML SOLN COMPARISON:  None. FINDINGS: ANTERIOR CIRCULATION: --Intracranial internal carotid arteries: Normal. --Anterior cerebral arteries: Normal. The left A1 segment is hypoplastic a small 2 mm infundibulum near the left A1-A2 junction. --Middle cerebral arteries: Normal. --Posterior communicating arteries: Absent bilaterally. POSTERIOR CIRCULATION: --Basilar artery: Normal. --Posterior cerebral arteries: Normal. --Superior cerebellar arteries: Normal. --Inferior  cerebellar arteries: Normal anterior and posterior inferior cerebellar arteries. IMPRESSION: No aneurysm or large vessel occlusion. Unchanged distribution of subarachnoid hemorrhage. Electronically Signed   By: Deatra Robinson M.D.   On: 01/10/2018 01:17   Dg Chest 2 View  Result Date: 01/10/2018 CLINICAL DATA:  Acute onset of dizziness and syncope. EXAM: CHEST - 2 VIEW COMPARISON:  Chest radiograph performed 01/06/2018 FINDINGS: The lungs are well-aerated. Mildly increased interstitial markings could reflect minimal interstitial edema. There is no evidence of pleural effusion or pneumothorax. The heart is borderline normal in size. No acute osseous abnormalities are seen. IMPRESSION: Mildly increased interstitial markings may reflect minimal interstitial edema. Electronically Signed   By: Roanna Raider M.D.   On: 01/10/2018 00:17   Ct Head Wo Contrast  Result Date: 01/10/2018 CLINICAL DATA:  Dizziness and syncope.  Fall with head trauma. EXAM: CT HEAD WITHOUT CONTRAST CT CERVICAL SPINE WITHOUT CONTRAST TECHNIQUE: Multidetector CT imaging of the head and cervical spine was performed following the standard protocol without intravenous contrast. Multiplanar CT image reconstructions of the cervical spine were also generated. COMPARISON:  CT head 12/29/2017 FINDINGS: CT HEAD FINDINGS Brain: There is multifocal subarachnoid hemorrhage, including within the anterior interhemispheric fissure, left sylvian fissure, left lateral ventricle and both ambient cisterns. There is no midline shift or mass effect. There is a small focus of intraparenchymal hemorrhage in the left frontal pole measuring 6 mm. No hydrocephalus. There is periventricular hypoattenuation compatible with chronic microvascular disease. Vascular: No hyperdense vessel. Bilateral skull base ICA atherosclerotic calcification. Skull: No skull fracture.  Left parietal scalp hematoma. Sinuses/Orbits: No sinus fluid levels or advanced mucosal thickening. No  mastoid effusion. Normal orbits.  CT CERVICAL SPINE FINDINGS Alignment: Alignment of the cervical spine is normal. There is flowing anterior osteophytosis along the entire length of the cervical spine. Skull base and vertebrae: No acute fracture. Soft tissues and spinal canal: No prevertebral fluid or swelling. No visible canal hematoma. Disc levels: No advanced spinal canal or neural foraminal stenosis. Upper chest: No pneumothorax, pulmonary nodule or pleural effusion. Other: Normal visualized paraspinal cervical soft tissues. IMPRESSION: 1. Small focus (6 mm) of left frontal pole hemorrhagic contusion with multifocal subarachnoid hemorrhage in the inner hemispheric fissure, left sylvian fissure, left lateral ventricle and both ambient cisterns. The subarachnoid blood is most likely of traumatic origin. 2. Left parietal scalp hematoma without skull fracture. 3. No acute fracture or static subluxation of the cervical spine. 4. Bulky, flowing anterior cervical spine osteophytes from C2 to T1, consistent with diffuse idiopathic skeletal hyperostosis. Critical Value/emergent results were called by telephone at the time of interpretation on 01/10/2018 at 12:11 am to Dr. Lucrezia Europe , who verbally acknowledged these results. Electronically Signed   By: Deatra Robinson M.D.   On: 01/10/2018 00:19   Ct Cervical Spine Wo Contrast  Result Date: 01/10/2018 CLINICAL DATA:  Dizziness and syncope.  Fall with head trauma. EXAM: CT HEAD WITHOUT CONTRAST CT CERVICAL SPINE WITHOUT CONTRAST TECHNIQUE: Multidetector CT imaging of the head and cervical spine was performed following the standard protocol without intravenous contrast. Multiplanar CT image reconstructions of the cervical spine were also generated. COMPARISON:  CT head 12/29/2017 FINDINGS: CT HEAD FINDINGS Brain: There is multifocal subarachnoid hemorrhage, including within the anterior interhemispheric fissure, left sylvian fissure, left lateral ventricle and both  ambient cisterns. There is no midline shift or mass effect. There is a small focus of intraparenchymal hemorrhage in the left frontal pole measuring 6 mm. No hydrocephalus. There is periventricular hypoattenuation compatible with chronic microvascular disease. Vascular: No hyperdense vessel. Bilateral skull base ICA atherosclerotic calcification. Skull: No skull fracture.  Left parietal scalp hematoma. Sinuses/Orbits: No sinus fluid levels or advanced mucosal thickening. No mastoid effusion. Normal orbits. CT CERVICAL SPINE FINDINGS Alignment: Alignment of the cervical spine is normal. There is flowing anterior osteophytosis along the entire length of the cervical spine. Skull base and vertebrae: No acute fracture. Soft tissues and spinal canal: No prevertebral fluid or swelling. No visible canal hematoma. Disc levels: No advanced spinal canal or neural foraminal stenosis. Upper chest: No pneumothorax, pulmonary nodule or pleural effusion. Other: Normal visualized paraspinal cervical soft tissues. IMPRESSION: 1. Small focus (6 mm) of left frontal pole hemorrhagic contusion with multifocal subarachnoid hemorrhage in the inner hemispheric fissure, left sylvian fissure, left lateral ventricle and both ambient cisterns. The subarachnoid blood is most likely of traumatic origin. 2. Left parietal scalp hematoma without skull fracture. 3. No acute fracture or static subluxation of the cervical spine. 4. Bulky, flowing anterior cervical spine osteophytes from C2 to T1, consistent with diffuse idiopathic skeletal hyperostosis. Critical Value/emergent results were called by telephone at the time of interpretation on 01/10/2018 at 12:11 am to Dr. Lucrezia Europe , who verbally acknowledged these results. Electronically Signed   By: Deatra Robinson M.D.   On: 01/10/2018 00:19    ____________________________________________   PROCEDURES  Procedure(s) performed: None  Procedures  Critical Care performed:  No  ____________________________________________   INITIAL IMPRESSION / ASSESSMENT AND PLAN / ED COURSE  As part of my medical decision making, I reviewed the following data within the electronic MEDICAL RECORD NUMBER Notes from prior ED visits and [place] Controlled Substance  Database   This is a 67 year old male who comes into the hospital today with a syncopal episode.  The patient has a history of heart failure and end-stage renal disease on dialysis.  He was doing well today without any shortness of breath or any other complaints or concerns.  The family states that his legs are swollen more than they were when he was in the hospital.  I will check some blood work to include a CBC, CMP, troponin and BNP  I will send the patient for a CT scan of his head and cervical spine as well as a chest x-ray.  I will reassess the patient once I received all of his results.  Clinical Course as of Jan 10 329  Tue Jan 10, 2018  0130 I did contact Dr. Adriana Simas from neurosurgery to obtain his recommendations.  Given the extent of the patient's subarachnoid hemorrhage especially with it being in his lateral ventricle and sylvian fissures my assumption would be that the patient would need transfer for higher level of care.  Dr. Adriana Simas does agree especially given the fact that the patient does take aspirin.  I will contact [facility] to arrange transport.   [AW]    Clinical Course User Index [AW] Zenda Alpers Melchor Amour, MD   I did contact [facility].  I spoke with Dr. Threasa Beards who is covering for neurosurgery.  He recommends giving the patient a dose of Keppra and transferring the patient to the emergency department at [facility].  At this time we do not have any Keppra in the hospital so the patient will be transported prior to receiving his Keppra.  The patient has no complaints at this time.  ____________________________________________   FINAL CLINICAL IMPRESSION(S) / ED  DIAGNOSES  Final diagnoses:  Traumatic subarachnoid hemorrhage without open intracranial wound, with loss of consciousness of 30 minutes or less, initial encounter (HCC)  Contusion of left cerebral hemisphere, with loss of consciousness of 30 minutes or less, initial encounter [facility])     ED Discharge Orders    None       Note:  This document was prepared using Dragon voice recognition software and may include unintentional dictation errors.    Rebecka Apley, MD 01/10/18 0330

## 2018-01-09 NOTE — ED Triage Notes (Signed)
Pt arrived via ems for c/o dizziness and syncopal episode - pt does not remember passing out - pt has contusion to left side of head - ems states that it appears that he hit his head on a box in the floor

## 2018-01-10 ENCOUNTER — Encounter: Payer: Self-pay | Admitting: Radiology

## 2018-01-10 ENCOUNTER — Emergency Department: Payer: Medicare (Managed Care)

## 2018-01-10 LAB — BRAIN NATRIURETIC PEPTIDE: B Natriuretic Peptide: >4500 pg/mL — ABNORMAL HIGH (ref 0.0–100.0)

## 2018-01-10 LAB — TROPONIN I: TROPONIN I: 0.04 ng/mL — AB (ref <0.03)

## 2018-01-10 MED ORDER — LEVETIRACETAM IN NACL 1000 MG/100ML IV SOLN
1000.0000 mg | Freq: Once | INTRAVENOUS | Status: DC
Start: 2018-01-10 — End: 2018-01-10

## 2018-01-10 MED ORDER — IOHEXOL 350 MG/ML SOLN
75.0000 mL | Freq: Once | INTRAVENOUS | Status: AC | PRN
  Administered 2018-01-10: 75 mL via INTRAVENOUS

## 2018-01-10 NOTE — ED Notes (Signed)
EMTALA checked for completion  

## 2018-01-10 NOTE — ED Notes (Signed)
Test: Trop Critical Value: 0.04 Name of Provider Notified: Zenda AlpersWebster

## 2018-01-10 NOTE — ED Notes (Signed)
[facility] ED unable to take report at this time. Instructed me to call back in 15-20 minutes. [phone]

## 2018-01-10 NOTE — ED Notes (Signed)
Patient is resting comfortably in ED stretcher at this time with no signs of distress present. Patient family at bedside. Equal, unlabored rise and fall of chest noted within normal rate. VS stable. Will continue to monitor. Pt mildly confused.

## 2018-01-10 NOTE — ED Notes (Signed)
Pt A&O, but lethargic.

## 2018-01-10 NOTE — ED Notes (Signed)
2 unsuccessful PIV attempt by this RN. 

## 2018-01-18 NOTE — Discharge Summary (Signed)
[employer] - [place] at [facility]   PATIENT NAME: Nathan Freeman    MR#:  [id number]  DATE OF BIRTH:  1950/12/26  DATE OF ADMISSION:  01/06/2018 ADMITTING PHYSICIAN: Oralia Manis, MD  DATE OF DISCHARGE: 01/08/2018  5:37 PM  PRIMARY CARE PHYSICIAN: [facility]   ADMISSION DIAGNOSIS:  Hyperkalemia [E87.5] Dyspnea, unspecified type [R06.00]  DISCHARGE DIAGNOSIS:  Principal Problem:   Fluid overload Active Problems:   HLD (hyperlipidemia)   HTN (hypertension)   Acute on chronic combined systolic and diastolic CHF (congestive heart failure) (HCC)   ESRD on dialysis (HCC)   SECONDARY DIAGNOSIS:   Past Medical History:  Diagnosis Date  . Acute pericarditis    Probable, Echo (7/10) showed mild LVH, normal LV size and systolic function, EF 55-60%, normal wall motion, mild MR, no AS, partially loculated small effusion along the RA free wall  . Anemia   . CHF (congestive heart failure) (HCC)   . DDD (degenerative disc disease), lumbar    Spine  . Depression   . Diabetes mellitus type II    Poor control due to medication noncompliance. Last hgbA1c 13.2  . Edema   . Hepatitis, unspecified   . Hyperlipidemia   . Hyperparathyroidism (HCC)   . Hypertension    Cough with a BP med in teh past, sounds like ACEI  . Kidney stones    dialysis t,t,s  . Malnutrition (HCC)   . Sleep apnea    cpap  . Stroke [facility])    02/2013 left side weakness     ADMITTING HISTORY  HISTORY OF PRESENT ILLNESS:  Nathan Freeman  is a 68 y.o. male who presents with some shortness of breath and lower extremity swelling.  Patient missed a dialysis session this week.  Work-up here in the ED is consistent with fluid overload and heart failure exacerbation.  Hospitalist were called for admission  HOSPITAL COURSE:   *Acute on chronic diastolic congestive heart failure in setting of end-stage renal disease.  Patient had hemodialysis in the hospital with -3 L. Shortness of  breath has resolved.  CHF resolved.  Saturating 94% while ambulating on room air.  Anemia of chronic disease and hypertension stable  Patient was discharged home after counseling on multiple occasions to be compliant with his dialysis schedule.   CONSULTS OBTAINED:  Treatment Team:  Mady Haagensen, MD  DRUG ALLERGIES:  No Known Allergies  DISCHARGE MEDICATIONS:   Allergies as of 01/08/2018   No Known Allergies     Medication List    TAKE these medications   acetaminophen 650 MG CR tablet Commonly known as:  TYLENOL Take 1,300 mg by mouth 2 (two) times daily as needed for pain (back pain).   albuterol (2.5 MG/3ML) 0.083% nebulizer solution Commonly known as:  PROVENTIL Take 2.5 mg by nebulization every 6 (six) hours as needed for wheezing or shortness of breath.   amLODipine 5 MG tablet Commonly known as:  NORVASC Take 5 mg by mouth every Tuesday, Thursday, Saturday, and Sunday.   aspirin 81 MG EC tablet Take 81 mg by mouth daily.   buPROPion 150 MG 24 hr tablet Commonly known as:  WELLBUTRIN XL Take 150 mg by mouth daily.   calcitRIOL 0.25 MCG capsule Commonly known as:  ROCALTROL Take 0.25 mcg by mouth 3 (three) times a week. On dialysis days   carvedilol 25 MG tablet Commonly known as:  COREG Take 25 mg by mouth 2 (two) times daily with a meal.  chlorpheniramine-HYDROcodone 10-8 MG/5ML Suer Commonly known as:  TUSSIONEX Take 5 mLs by mouth every 12 (twelve) hours as needed for cough.   citalopram 40 MG tablet Commonly known as:  CELEXA Take 40 mg by mouth daily.   docusate sodium 100 MG capsule Commonly known as:  COLACE Take 100 mg by mouth daily as needed for mild constipation.   fish oil-omega-3 fatty acids 1000 MG capsule Take 2 capsules by mouth daily with supper.   guaiFENesin 600 MG 12 hr tablet Commonly known as:  MUCINEX Take 600 mg by mouth 2 (two) times daily as needed for cough.   HYDROcodone-acetaminophen 5-325 MG tablet Commonly  known as:  NORCO Take 1 tablet by mouth every 6 (six) hours as needed for moderate pain.   lisinopril 10 MG tablet Commonly known as:  PRINIVIL,ZESTRIL Take 10 mg by mouth daily.   pantoprazole 40 MG tablet Commonly known as:  PROTONIX Take 40 mg by mouth daily.   polycarbophil 625 MG tablet Commonly known as:  FIBERCON Take 625 mg by mouth 2 (two) times daily.   predniSONE 10 MG (21) Tbpk tablet Commonly known as:  STERAPRED UNI-PAK 21 TAB 6 tabs day 1 and taper 10 mg a day - 6 days   ranitidine 150 MG tablet Commonly known as:  ZANTAC Take 150 mg by mouth 2 (two) times daily.   rosuvastatin 10 MG tablet Commonly known as:  CRESTOR Take 10 mg by mouth daily.   sevelamer carbonate 800 MG tablet Commonly known as:  RENVELA Take 800 mg by mouth 3 (three) times daily with meals.   tamsulosin 0.4 MG Caps capsule Commonly known as:  FLOMAX Take 0.4 mg by mouth daily.   traMADol 50 MG tablet Commonly known as:  ULTRAM Take 50 mg by mouth every 6 (six) hours as needed for moderate pain (back pain).       Today   VITAL SIGNS:  Blood pressure (!) 143/74, pulse 72, temperature 97.8 F (36.6 C), temperature source Oral, resp. rate 18, height  (1.854 m), weight 82.7 kg (182 lb 6.4 oz), SpO2 92 %.  I/O:  No intake or output data in the 24 hours ending 01/18/18 1522  PHYSICAL EXAMINATION:  Physical Exam  GENERAL:  67 y.o.-year-old patient lying in the bed with no acute distress.  LUNGS: Normal breath sounds bilaterally, no wheezing, rales,rhonchi or crepitation. No use of accessory muscles of respiration.  CARDIOVASCULAR: S1, S2 normal. No murmurs, rubs, or gallops.  ABDOMEN: Soft, non-tender, non-distended. Bowel sounds present. No organomegaly or mass.  NEUROLOGIC: Moves all 4 extremities. PSYCHIATRIC: The patient is alert and oriented x 3.  SKIN: No obvious rash, lesion, or ulcer.   DATA REVIEW:   CBC No results for input(s): WBC, HGB, HCT, PLT in the last  168 hours.  Chemistries  No results for input(s): NA, K, CL, CO2, GLUCOSE, BUN, CREATININE, CALCIUM, MG, AST, ALT, ALKPHOS, BILITOT in the last 168 hours.  Invalid input(s): GFRCGP  Cardiac Enzymes No results for input(s): TROPONINI in the last 168 hours.  Microbiology Results  No results found for this or any previous visit.  RADIOLOGY:  No results found.  Follow up with PCP in 1 week.  Management plans discussed with the patient, family and they are in agreement.  CODE STATUS:  Code Status History    Date Active Date Inactive Code Status Order ID Comments User Context   01/07/2018 0006 01/08/2018 2042 Full Code [id number]  Oralia Manis, MD ED  Advance Directive Documentation     Most Recent Value  Type of Advance Directive  Living will, Healthcare Power of Attorney  Pre-existing out of facility DNR order (yellow form or pink MOST form)  -  "MOST" Form in Place?  -      TOTAL TIME TAKING CARE OF THIS PATIENT ON DAY OF DISCHARGE: more than 30 minutes.   Molinda Bailiff Tawni Melkonian M.D on 01/18/2018 at 3:22 PM  Between 7am to 6pm - Pager - [phone]  After 6pm go to [email or web address] - password [email or web address] [facility]  [facility] [place] [facility]  Office  [phone]  CC: Primary care physician; [employer]  Note: This dictation was prepared with Nurse, children's dictation along with smaller Lobbyist. Any transcriptional errors that result from this process are unintentional.

## 2018-02-28 ENCOUNTER — Inpatient Hospital Stay: Admit: 2018-02-28 | Payer: Self-pay

## 2018-03-01 ENCOUNTER — Encounter: Payer: Self-pay | Admitting: Emergency Medicine

## 2018-03-01 ENCOUNTER — Emergency Department
Admission: EM | Admit: 2018-03-01 | Discharge: 2018-03-01 | Disposition: A | Discharge status: Home / Self Care | Payer: Medicare (Managed Care) | Attending: Emergency Medicine | Admitting: Emergency Medicine

## 2018-03-01 DIAGNOSIS — Z8673 Personal history of transient ischemic attack (TIA), and cerebral infarction without residual deficits: Secondary | ICD-10-CM | POA: Diagnosis not present

## 2018-03-01 DIAGNOSIS — Z87891 Personal history of nicotine dependence: Secondary | ICD-10-CM | POA: Insufficient documentation

## 2018-03-01 DIAGNOSIS — Z7982 Long term (current) use of aspirin: Secondary | ICD-10-CM | POA: Insufficient documentation

## 2018-03-01 DIAGNOSIS — I110 Hypertensive heart disease with heart failure: Secondary | ICD-10-CM | POA: Diagnosis not present

## 2018-03-01 DIAGNOSIS — R1084 Generalized abdominal pain: Secondary | ICD-10-CM | POA: Diagnosis present

## 2018-03-01 DIAGNOSIS — I10 Essential (primary) hypertension: Secondary | ICD-10-CM | POA: Insufficient documentation

## 2018-03-01 DIAGNOSIS — N200 Calculus of kidney: Secondary | ICD-10-CM | POA: Insufficient documentation

## 2018-03-01 DIAGNOSIS — E119 Type 2 diabetes mellitus without complications: Secondary | ICD-10-CM | POA: Diagnosis not present

## 2018-03-01 DIAGNOSIS — Z79899 Other long term (current) drug therapy: Secondary | ICD-10-CM | POA: Insufficient documentation

## 2018-03-01 DIAGNOSIS — I5042 Chronic combined systolic (congestive) and diastolic (congestive) heart failure: Secondary | ICD-10-CM | POA: Diagnosis not present

## 2018-03-01 LAB — BASIC METABOLIC PANEL
Anion gap: 14 (ref 5–15)
BUN: 22 mg/dL — AB (ref 6–20)
CHLORIDE: 95 mmol/L — AB (ref 101–111)
CO2: 29 mmol/L (ref 22–32)
CREATININE: 2.28 mg/dL — AB (ref 0.61–1.24)
Calcium: 9.4 mg/dL (ref 8.9–10.3)
GFR calc Af Amer: 33 mL/min — ABNORMAL LOW (ref >60)
GFR calc non Af Amer: 28 mL/min — ABNORMAL LOW (ref >60)
GLUCOSE: 142 mg/dL — AB (ref 65–99)
Potassium: 4.3 mmol/L (ref 3.5–5.1)
SODIUM: 138 mmol/L (ref 135–145)

## 2018-03-01 LAB — URINALYSIS, COMPLETE (UACMP) WITH MICROSCOPIC
Bacteria, UA: NONE SEEN
Bilirubin Urine: NEGATIVE
Glucose, UA: NEGATIVE mg/dL
KETONES UR: NEGATIVE mg/dL
Nitrite: NEGATIVE
PH: 7 (ref 5.0–8.0)
Protein, ur: 100 mg/dL — AB
Specific Gravity, Urine: 1.017 (ref 1.005–1.030)
WBC, UA: >50 WBC/hpf — ABNORMAL HIGH (ref 0–5)

## 2018-03-01 LAB — CBC
HCT: 31.1 % — ABNORMAL LOW (ref 40.0–52.0)
HEMOGLOBIN: 10.6 g/dL — AB (ref 13.0–18.0)
MCH: 32.9 pg (ref 26.0–34.0)
MCHC: 34.2 g/dL (ref 32.0–36.0)
MCV: 96.2 fL (ref 80.0–100.0)
Platelets: 201 10*3/uL (ref 150–440)
RBC: 3.23 MIL/uL — ABNORMAL LOW (ref 4.40–5.90)
RDW: 15.8 % — ABNORMAL HIGH (ref 11.5–14.5)
WBC: 14.9 10*3/uL — ABNORMAL HIGH (ref 3.8–10.6)

## 2018-03-01 LAB — LACTIC ACID, PLASMA: LACTIC ACID, VENOUS: 1.3 mmol/L (ref 0.5–1.9)

## 2018-03-01 MED ORDER — ONDANSETRON 4 MG PO TBDP: 4.0000 mg | ORAL_TABLET | Freq: Three times a day (TID) | ORAL | 0 refills | Status: AC | PRN

## 2018-03-01 MED ORDER — CEPHALEXIN 500 MG PO CAPS: 500.0000 mg | ORAL_CAPSULE | Freq: Three times a day (TID) | ORAL | 0 refills | Status: AC

## 2018-03-01 MED ORDER — TAMSULOSIN HCL 0.4 MG PO CAPS: 0.4000 mg | ORAL_CAPSULE | Freq: Every day | ORAL | 0 refills | Status: AC

## 2018-03-01 MED ORDER — OXYCODONE-ACETAMINOPHEN 5-325 MG PO TABS: 1.0000 | ORAL_TABLET | ORAL | 0 refills | Status: DC | PRN

## 2018-03-01 NOTE — Discharge Instructions (Addendum)
Take Flomax every day.  Take Percocet as needed for pain.  Take Zofran as needed for nausea.  Take Keflex 3 times a day to prevent an overlying infection.  Return to the emergency room if you have abdominal pain, fever, nausea, vomiting.  Otherwise follow-up with Dr. Vanna ScotlandAshley [name].  Make sure to call her office first thing in the morning for an appointment.

## 2018-03-01 NOTE — ED Provider Notes (Signed)
[facility] Emergency Department Provider Note  ____________________________________________  Time seen: Approximately 6:59 PM  I have reviewed the triage vital signs and the nursing notes.   HISTORY  Chief Complaint Flank Pain   HPI Nathan Freeman is a 67 y.o. male with a history as listed below who presents for evaluation of a kidney stone.  Patient was seen by his primary care NP yesterday and was complaining of left flank pain.  He underwent a CT abdomen pelvis which showed a mid ureter 5 mm stone.  He also had lab work done which showed a white count of 17K and patient had a temp of 99.68F.  The primary care provider attempted to get a catheterized urine specimen since patient is end-stage renal disease and that was unsuccessful.  She gave him a dose of Rocephin for concerns of possible superinfected stone and send him home.  Patient was sent home on a condom catheter however has not made any urine in the last 24 hours.  Today she called urology at [facility] who recommended the patient went to the emergency room for a stent since patient is end-stage renal disease and does not make constant urine which per [facility] will make it hard for patient to pass the stone (he makes urine every other day a very small amount).  Patient reports prior history of kidney stones.  Patient has required lithotripsy in the past.  He denies abdominal pain, nausea, vomiting, fever or chills.  Patient does report mild intermittent left-sided flank pain none at this time.  Past Medical History:  Diagnosis Date  . Acute pericarditis    Probable, Echo (7/10) showed mild LVH, normal LV size and systolic function, EF 55-60%, normal wall motion, mild MR, no AS, partially loculated small effusion along the RA free wall  . Anemia   . CHF (congestive heart failure) (HCC)   . DDD (degenerative disc disease), lumbar    Spine  . Depression   . Diabetes mellitus type II    Poor control due to  medication noncompliance. Last hgbA1c 13.2  . Edema   . Hepatitis, unspecified   . Hyperlipidemia   . Hyperparathyroidism (HCC)   . Hypertension    Cough with a BP med in teh past, sounds like ACEI  . Kidney stones    dialysis t,t,s  . Malnutrition (HCC)   . Sleep apnea    cpap  . Stroke [facility])    02/2013 left side weakness    Patient Active Problem List   Diagnosis Date Noted  . Fluid overload 01/06/2018  . Acute on chronic combined systolic and diastolic CHF (congestive heart failure) (HCC) 01/06/2018  . Diabetes (HCC) 01/06/2018  . ESRD on dialysis (HCC) 01/06/2018  . HLD (hyperlipidemia) 04/03/2009  . HTN (hypertension) 04/03/2009  . PERICARDITIS, ACUTE 04/03/2009  . SHORTNESS OF BREATH 04/03/2009    Past Surgical History:  Procedure Laterality Date  . AV FISTULA PLACEMENT Left 12/11/2015   Procedure: ARTERIOVENOUS (AV) FISTULA CREATION ,BRACHIOCEPHALIC;  Surgeon: Annice Needy, MD;  Location: [facility] ORS;  Service: Vascular;  Laterality: Left;  . COLONOSCOPY    . COLONOSCOPY WITH PROPOFOL N/A 05/17/2016   Procedure: COLONOSCOPY WITH PROPOFOL;  Surgeon: Christena Deem, MD;  Location: [facility] ENDOSCOPY;  Service: Endoscopy;  Laterality: N/A;  . INSERTION OF DIALYSIS CATHETER Right    chest  . LITHOTRIPSY    . ROTATOR CUFF REPAIR     Left shoulder, 2 times  . TONSILLECTOMY  Prior to Admission medications   Medication Sig Start Date End Date Taking? Authorizing Provider  acetaminophen (TYLENOL) 650 MG CR tablet Take 1,300 mg by mouth 2 (two) times daily as needed for pain (back pain).    [provider]  albuterol (PROVENTIL) (2.5 MG/3ML) 0.083% nebulizer solution Take 2.5 mg by nebulization every 6 (six) hours as needed for wheezing or shortness of breath.    [provider]  amLODipine (NORVASC) 5 MG tablet Take 5 mg by mouth every Tuesday, Thursday, Saturday, and Sunday.     [provider]  aspirin 81 MG EC tablet Take 81 mg by mouth daily.       [provider]  buPROPion (WELLBUTRIN XL) 150 MG 24 hr tablet Take 150 mg by mouth daily.    [provider]  calcitRIOL (ROCALTROL) 0.25 MCG capsule Take 0.25 mcg by mouth 3 (three) times a week. On dialysis days    [provider]  carvedilol (COREG) 25 MG tablet Take 25 mg by mouth 2 (two) times daily with a meal.    [provider]  cephALEXin (KEFLEX) 500 MG capsule Take 1 capsule (500 mg total) by mouth 3 (three) times daily for 7 days. 03/01/18 03/08/18  Nita Sickle, MD  chlorpheniramine-HYDROcodone (TUSSIONEX) 10-8 MG/5ML SUER Take 5 mLs by mouth every 12 (twelve) hours as needed for cough. 01/08/18   Milagros Loll, MD  citalopram (CELEXA) 40 MG tablet Take 40 mg by mouth daily.    [provider]  docusate sodium (COLACE) 100 MG capsule Take 100 mg by mouth daily as needed for mild constipation.    [provider]  fish oil-omega-3 fatty acids 1000 MG capsule Take 2 capsules by mouth daily with supper.     [provider]  guaiFENesin (MUCINEX) 600 MG 12 hr tablet Take 600 mg by mouth 2 (two) times daily as needed for cough.    [provider]  HYDROcodone-acetaminophen (NORCO) 5-325 MG tablet Take 1 tablet by mouth every 6 (six) hours as needed for moderate pain. Patient not taking: Reported on 01/06/2018 12/11/15   Annice Needy, MD  lisinopril (PRINIVIL,ZESTRIL) 10 MG tablet Take 10 mg by mouth daily.    [provider]  ondansetron (ZOFRAN ODT) 4 MG disintegrating tablet Take 1 tablet (4 mg total) by mouth every 8 (eight) hours as needed for nausea or vomiting. 03/01/18   Don Perking, [place], MD  oxyCODONE-acetaminophen (PERCOCET) 5-325 MG tablet Take 1 tablet by mouth every 4 (four) hours as needed for severe pain. 03/01/18   Don Perking, [place], MD  pantoprazole (PROTONIX) 40 MG tablet Take 40 mg by mouth daily.    [provider]  polycarbophil (FIBERCON) 625 MG tablet Take 625 mg by mouth 2  (two) times daily.    [provider]  predniSONE (STERAPRED UNI-PAK 21 TAB) 10 MG (21) TBPK tablet 6 tabs day 1 and taper 10 mg a day - 6 days Patient not taking: Reported on 01/09/2018 01/08/18   Milagros Loll, MD  ranitidine (ZANTAC) 150 MG tablet Take 150 mg by mouth 2 (two) times daily.    [provider]  rosuvastatin (CRESTOR) 10 MG tablet Take 10 mg by mouth daily.    [provider]  sevelamer carbonate (RENVELA) 800 MG tablet Take 800 mg by mouth 3 (three) times daily with meals.    [provider]  tamsulosin (FLOMAX) 0.4 MG CAPS capsule Take 1 capsule (0.4 mg total) by mouth daily for 10 days.  03/01/18 03/11/18  Nita Sickle, MD  traMADol (ULTRAM) 50 MG tablet Take 50 mg by mouth every 6 (six) hours as needed for moderate pain (back pain).     [provider]    Allergies Patient has no known allergies.  Family History  Problem Relation Age of Onset  . Heart failure Mother   . Ulcers Mother        Venous ulcers  . Heart failure Father   . Heart attack Brother        3 heart attacks    Social History Social History   Tobacco Use  . Smoking status: Former Smoker    Types: Cigarettes    Last attempt to quit: 09/20/1972    Years since quitting: 45.4  . Smokeless tobacco: Never Used  . Tobacco comment: Quit at age 54  Substance Use Topics  . Alcohol use: No  . Drug use: No    Review of Systems  Constitutional: Negative for fever. Eyes: Negative for visual changes. ENT: Negative for sore throat. Neck: No neck pain  Cardiovascular: Negative for chest pain. Respiratory: Negative for shortness of breath. Gastrointestinal: Negative for abdominal pain, vomiting or diarrhea. Genitourinary: Negative for dysuria. + L flank pain Musculoskeletal: Negative for back pain. Skin: Negative for rash. Neurological: Negative for headaches, weakness or numbness. Psych: No SI or  HI  ____________________________________________   PHYSICAL EXAM:  VITAL SIGNS: ED Triage Vitals  Enc Vitals Group     BP 03/01/18 1634 (!) 114/48     Pulse Rate 03/01/18 1634 77     Resp 03/01/18 1634 18     Temp 03/01/18 1634 99.3 F (37.4 C)     Temp Source 03/01/18 1634 Oral     SpO2 03/01/18 1634 94 %     Weight 03/01/18 1634 200 lb (90.7 kg)     Height 03/01/18 1634 6' (1.829 m)     Head Circumference --      Peak Flow --      Pain Score 03/01/18 1633 8     Pain Loc --      Pain Edu? --      Excl. in GC? --     Constitutional: Alert and oriented. Well appearing and in no apparent distress. HEENT:      Head: Normocephalic and atraumatic.         Eyes: Conjunctivae are normal. Sclera is non-icteric.       Mouth/Throat: Mucous membranes are moist.       Neck: Supple with no signs of meningismus. Cardiovascular: Regular rate and rhythm. No murmurs, gallops, or rubs. 2+ symmetrical distal pulses are present in all extremities. No JVD. Respiratory: Normal respiratory effort. Lungs are clear to auscultation bilaterally. No wheezes, crackles, or rhonchi.  Gastrointestinal: Soft, non tender, and non distended with positive bowel sounds. No rebound or guarding. Genitourinary: No CVA tenderness. Musculoskeletal: Nontender with normal range of motion in all extremities. No edema, cyanosis, or erythema of extremities. Neurologic: Normal speech and language. Face is symmetric. Moving all extremities. No gross focal neurologic deficits are appreciated. Skin: Skin is warm, dry and intact. No rash noted. Psychiatric: Mood and affect are normal. Speech and behavior are normal.  ____________________________________________   LABS (all labs ordered are listed, but only abnormal results are displayed)  Labs Reviewed  URINALYSIS, COMPLETE (UACMP) WITH MICROSCOPIC - Abnormal; Notable for the following components:      Result Value   Color, Urine AMBER (*)    APPearance CLOUDY (*)  Hgb urine dipstick MODERATE (*)    Protein, ur 100 (*)    Leukocytes, UA LARGE (*)    WBC, UA >50 (*)    Non Squamous Epithelial 0-5 (*)    All other components within normal limits  CBC - Abnormal; Notable for the following components:   WBC 14.9 (*)    RBC 3.23 (*)    Hemoglobin 10.6 (*)    HCT 31.1 (*)    RDW 15.8 (*)    All other components within normal limits  BASIC METABOLIC PANEL - Abnormal; Notable for the following components:   Chloride 95 (*)    Glucose, Bld 142 (*)    BUN 22 (*)    Creatinine, Ser 2.28 (*)    GFR calc non Af Amer 28 (*)    GFR calc Af Amer 33 (*)    All other components within normal limits  CULTURE, BLOOD (ROUTINE X 2)  CULTURE, BLOOD (ROUTINE X 2)  URINE CULTURE  LACTIC ACID, PLASMA   ____________________________________________  EKG  none  ____________________________________________  RADIOLOGY  none  ____________________________________________   PROCEDURES  Procedure(s) performed: None Procedures Critical Care performed:  None ____________________________________________   INITIAL IMPRESSION / ASSESSMENT AND PLAN / ED COURSE   67 y.o. male with a history as listed below who presents for evaluation of a kidney stone and concerns of possible overlying infection.  Patient is well-appearing, no distress, he has a low-grade temperature of 99.28F but remaining of his vital signs are within normal limits.  Patient has very minimal pain at this time.  I discussed with patient's NP and she is concerned because patient had same temperature yesterday and a white count of 17.  She gave patient a dose of Rocephin yesterday.  According to her [facility] urology recommended the patient came in for evaluation.  Patient underwent his dialysis treatment today.  Labs here show a white count of 14.9, stable creatinine. Lactic is pending. Will attempt to obtain a UA to eval for overlying infection. Blood cultures sent. Will consult urology     _________________________ 8:07 PM on 03/01/2018 -----------------------------------------  Patient's white count is trending down, lactic is normal, UA with inflammation but no signs of infection.  Urine culture is pending.  Patient has already received Rocephin by his primary care provider today.  Remains with no signs of sepsis.  I consulted Dr. Vanna Scotland from urology and discussed patient's presentation, and the fact that he is in end-stage renal disease on dialysis, his vitals, lab work and CT scan.  She agrees the patient will most likely need stenting or lithotripsy to pass the stone since he does not make a lot of urine but she does not think patient needs to be admitted.  She recommended sending patient home on Keflex and she will see him in her office within the next few days for an outpatient procedure.  I discussed with patient and his family member strict return precautions for fever, abdominal pain, nausea or vomiting.  I also updated patient's primary provider Haydee Salter, NP about patient's work-up in the emergency room and plan for follow-up.   As part of my medical decision making, I reviewed the following data within the electronic MEDICAL RECORD NUMBER History obtained from family, Nursing notes reviewed and incorporated, Labs reviewed , Old chart reviewed, Radiograph reviewed , A consult was requested and obtained from this/these consultant(s) Urology, Notes from prior ED visits and [place] Controlled Substance Database    Pertinent labs & imaging  results that were available during my care of the patient were reviewed by me and considered in my medical decision making (see chart for details).    ____________________________________________   FINAL CLINICAL IMPRESSION(S) / ED DIAGNOSES  Final diagnoses:  Kidney stone      NEW MEDICATIONS STARTED DURING THIS VISIT:  ED Discharge Orders        Ordered    tamsulosin (FLOMAX) 0.4 MG CAPS capsule  Daily     03/01/18 2005     oxyCODONE-acetaminophen (PERCOCET) 5-325 MG tablet  Every 4 hours PRN     03/01/18 2005    ondansetron (ZOFRAN ODT) 4 MG disintegrating tablet  Every 8 hours PRN     03/01/18 2005    cephALEXin (KEFLEX) 500 MG capsule  3 times daily     03/01/18 2005       Note:  This document was prepared using Dragon voice recognition software and may include unintentional dictation errors.    Don PerkingVeronese, [place], MD 03/01/18 2008

## 2018-03-01 NOTE — ED Triage Notes (Addendum)
Pt arrived with daughter after being sent by Urologist to remove a kidney stone in the left kidney. Pt reports left side flank pain that started 2 days ago

## 2018-03-02 ENCOUNTER — Encounter
Admission: RE | Admit: 2018-03-02 | Discharge: 2018-03-02 | Disposition: A | Discharge status: Home / Self Care | Payer: Medicare (Managed Care) | Source: Ambulatory Visit | Attending: Internal Medicine | Admitting: Internal Medicine

## 2018-03-02 ENCOUNTER — Other Ambulatory Visit: Payer: Self-pay

## 2018-03-02 ENCOUNTER — Telehealth: Payer: Self-pay | Admitting: Radiology

## 2018-03-02 DIAGNOSIS — N200 Calculus of kidney: Secondary | ICD-10-CM

## 2018-03-02 NOTE — Telephone Encounter (Signed)
Made Cherita with [facility] aware of appointment scheduled with Dr Apolinar JunesBrandon for ER follow up. Cherita voices understanding.

## 2018-03-02 NOTE — Telephone Encounter (Signed)
LMOM to return call regarding Dr Nathan Freeman's message below.  Message Contents  Nathan ScotlandBrandon, [name], MD  Demetrius CharityP Leroy LibmanBua Admin        Can you please double book this patient somewhere in my clinic Thursday or Friday? End of the day Friday would be my preference in [place].    Thanks   Nathan ScotlandAshley [name]

## 2018-03-03 ENCOUNTER — Encounter: Payer: Self-pay | Admitting: Urology

## 2018-03-03 ENCOUNTER — Other Ambulatory Visit
Admission: RE | Admit: 2018-03-03 | Discharge: 2018-03-03 | Disposition: A | Discharge status: Home / Self Care | Payer: Medicare (Managed Care) | Source: Ambulatory Visit | Attending: Urology | Admitting: Urology

## 2018-03-03 ENCOUNTER — Ambulatory Visit
Admission: RE | Admit: 2018-03-03 | Discharge: 2018-03-03 | Disposition: A | Discharge status: Home / Self Care | Payer: Medicare (Managed Care) | Source: Ambulatory Visit | Attending: Urology | Admitting: Urology

## 2018-03-03 ENCOUNTER — Ambulatory Visit (INDEPENDENT_AMBULATORY_CARE_PROVIDER_SITE_OTHER): Payer: Medicaid Other | Admitting: Urology

## 2018-03-03 VITALS — BP 117/54 | HR 68 | Ht 72.0 in | Wt 191.0 lb

## 2018-03-03 DIAGNOSIS — N200 Calculus of kidney: Secondary | ICD-10-CM | POA: Diagnosis present

## 2018-03-03 LAB — URINE CULTURE: Culture: NO GROWTH

## 2018-03-03 NOTE — Progress Notes (Unsigned)
03/03/2018 6:01 PM   Nathan Freeman 21-Jun-1951 [id number]  Referring provider: Inc, [facility] [address] [place], [place] [address]  Chief Complaint  Patient presents with  . Nephrolithiasis    HPI: 67 year old male who presents today referred from the emergency room for further evaluation of obstructing ureteral calculus.    The patient was seen and evaluated the emergency room on 03/01/2018 found to have   PMH: Past Medical History:  Diagnosis Date  . Acute pericarditis    Probable, Echo (7/10) showed mild LVH, normal LV size and systolic function, EF 55-60%, normal wall motion, mild MR, no AS, partially loculated small effusion along the RA free wall  . Anemia   . CHF (congestive heart failure) (HCC)   . DDD (degenerative disc disease), lumbar    Spine  . Depression   . Diabetes mellitus type II    Poor control due to medication noncompliance. Last hgbA1c 13.2  . Edema   . Hepatitis, unspecified   . Hyperlipidemia   . Hyperparathyroidism (HCC)   . Hypertension    Cough with a BP med in teh past, sounds like ACEI  . Kidney stones    dialysis t,t,s  . Malnutrition (HCC)   . Sleep apnea    cpap  . Stroke [facility])    02/2013 left side weakness    Surgical History: Past Surgical History:  Procedure Laterality Date  . A/V FISTULAGRAM Left 06/05/2018   Procedure: A/V FISTULAGRAM;  Surgeon: Annice Needy, MD;  Location: [facility] INVASIVE CV LAB;  Service: Cardiovascular;  Laterality: Left;  . AV FISTULA PLACEMENT Left 12/11/2015   Procedure: ARTERIOVENOUS (AV) FISTULA CREATION ,BRACHIOCEPHALIC;  Surgeon: Annice Needy, MD;  Location: [facility] ORS;  Service: Vascular;  Laterality: Left;  . COLONOSCOPY    . COLONOSCOPY WITH PROPOFOL N/A 05/17/2016   Procedure: COLONOSCOPY WITH PROPOFOL;  Surgeon: Christena Deem, MD;  Location: [facility] ENDOSCOPY;  Service: Endoscopy;  Laterality: N/A;  . FEMUR IM NAIL Left 04/08/2018   Procedure: INTRAMEDULLARY (IM) NAIL FEMORAL;   Surgeon: Kennedy Bucker, MD;  Location: [facility] ORS;  Service: Orthopedics;  Laterality: Left;  . INSERTION OF DIALYSIS CATHETER Right    chest  . LITHOTRIPSY    . ROTATOR CUFF REPAIR     Left shoulder, 2 times  . TONSILLECTOMY      Home Medications:  Allergies as of 03/03/2018   No Known Allergies     Medication List       Accurate as of March 03, 2018 11:59 PM. Always use your most recent med list.        acetaminophen 650 MG CR tablet Commonly known as:  TYLENOL Take 650 mg by mouth 2 (two) times daily.   albuterol (2.5 MG/3ML) 0.083% nebulizer solution Commonly known as:  PROVENTIL Take 2.5 mg by nebulization every 6 (six) hours as needed for wheezing or shortness of breath.   amLODipine 10 MG tablet Commonly known as:  NORVASC Take 10 mg by mouth daily.   aspirin 81 MG EC tablet Take 81 mg by mouth daily.   buPROPion 150 MG 24 hr tablet Commonly known as:  WELLBUTRIN XL Take 150 mg by mouth daily.   calcitRIOL 0.25 MCG capsule Commonly known as:  ROCALTROL Take 0.25 mcg by mouth 3 (three) times a week. On dialysis days (MON, WED, FRI)   carvedilol 25 MG tablet Commonly known as:  COREG Take 25 mg by mouth 2 (two) times daily with a meal.  cephALEXin 500 MG capsule Commonly known as:  KEFLEX Take 1 capsule (500 mg total) by mouth 3 (three) times daily for 7 days.   chlorpheniramine-HYDROcodone 10-8 MG/5ML Suer Commonly known as:  TUSSIONEX Take 5 mLs by mouth every 12 (twelve) hours as needed for cough.   citalopram 20 MG tablet Commonly known as:  CELEXA Take 20 mg by mouth daily.   docusate sodium 100 MG capsule Commonly known as:  COLACE Take 100 mg by mouth daily as needed for mild constipation.   fish oil-omega-3 fatty acids 1000 MG capsule Take 2 capsules by mouth daily with supper.   guaiFENesin 600 MG 12 hr tablet Commonly known as:  MUCINEX Take 600 mg by mouth 2 (two) times daily as needed for cough.   HYDROcodone-acetaminophen 5-325 MG  tablet Commonly known as:  NORCO Take 1 tablet by mouth every 6 (six) hours as needed for moderate pain.   lisinopril 10 MG tablet Commonly known as:  PRINIVIL,ZESTRIL Take 10 mg by mouth daily.   Melatonin 3 MG Tabs Take by mouth.   ondansetron 4 MG disintegrating tablet Commonly known as:  ZOFRAN ODT Take 1 tablet (4 mg total) by mouth every 8 (eight) hours as needed for nausea or vomiting.   oxyCODONE-acetaminophen 5-325 MG tablet Commonly known as:  PERCOCET Take 1 tablet by mouth every 4 (four) hours as needed for severe pain.   pantoprazole 40 MG tablet Commonly known as:  PROTONIX Take 40 mg by mouth daily.   polycarbophil 625 MG tablet Commonly known as:  FIBERCON Take 625 mg by mouth 2 (two) times daily.   predniSONE 10 MG (21) Tbpk tablet Commonly known as:  STERAPRED UNI-PAK 21 TAB 6 tabs day 1 and taper 10 mg a day - 6 days   ranitidine 150 MG tablet Commonly known as:  ZANTAC Take 150 mg by mouth 2 (two) times daily.   rosuvastatin 10 MG tablet Commonly known as:  CRESTOR Take 10 mg by mouth daily.   sevelamer carbonate 800 MG tablet Commonly known as:  RENVELA Take 800 mg by mouth 3 (three) times daily with meals.   tamsulosin 0.4 MG Caps capsule Commonly known as:  FLOMAX Take 1 capsule (0.4 mg total) by mouth daily for 10 days.   thiamine 100 MG tablet Take by mouth.   traMADol 50 MG tablet Commonly known as:  ULTRAM Take 50 mg by mouth every 6 (six) hours as needed for moderate pain (back pain).       Allergies: No Known Allergies  Family History: Family History  Problem Relation Age of Onset  . Heart failure Mother   . Ulcers Mother        Venous ulcers  . Heart failure Father   . Heart attack Brother        3 heart attacks    Social History:  reports that he quit smoking about 46 years ago. His smoking use included cigarettes. He has never used smokeless tobacco. He reports that he does not drink alcohol or use  drugs.  ROS: UROLOGY Frequent Urination?: Yes Hard to postpone urination?: No Burning/pain with urination?: No Get up at night to urinate?: No Leakage of urine?: No Urine stream starts and stops?: No Trouble starting stream?: No Do you have to strain to urinate?: No Blood in urine?: No Urinary tract infection?: Yes Sexually transmitted disease?: No Injury to kidneys or bladder?: No Painful intercourse?: No Weak stream?: No Erection problems?: No Penile pain?: No  Gastrointestinal  Nausea?: No Vomiting?: No Indigestion/heartburn?: No Diarrhea?: No Constipation?: No  Constitutional Fever: No Night sweats?: No Weight loss?: No Fatigue?: No  Skin Skin rash/lesions?: Yes Itching?: No  Eyes Blurred vision?: No Double vision?: No  Ears/Nose/Throat Sore throat?: No Sinus problems?: No  Hematologic/Lymphatic Swollen glands?: No Easy bruising?: Yes  Cardiovascular Leg swelling?: No Chest pain?: No  Respiratory Cough?: No Shortness of breath?: No  Endocrine Excessive thirst?: No  Musculoskeletal Back pain?: Yes Joint pain?: No  Neurological Headaches?: No Dizziness?: No  Psychologic Depression?: Yes Anxiety?: No  Physical Exam: BP (!) 117/54 (BP Location: Left Arm, Patient Position: Sitting, Cuff Size: Normal)   Pulse 68   Ht 6' (1.829 m)   Wt 191 lb (86.6 kg)   BMI 25.90 kg/m   Constitutional:  Alert and oriented, No acute distress.  In wheelchair, accompanied by 2 family members today.  Well, appears unwell. HEENT: [place] AT, moist mucus membranes.  Trachea midline, no masses. Respiratory: Normal respiratory effort, no increased work of breathing. GI: Abdomen is soft, nontender, nondistended, no abdominal masses Skin: No rashes, bruises or suspicious lesions. Neurologic: Grossly intact, no focal deficits, moving all 4 extremities. Psychiatric: Normal mood and affect.  Laboratory Data: Lab Results  Component Value Date   WBC 6.5 04/11/2018    HGB 9.6 (L) 04/11/2018   HCT 29.0 (L) 04/11/2018   MCV 97.9 04/11/2018   PLT 145 (L) 04/11/2018    Lab Results  Component Value Date   CREATININE 3.12 (H) 04/11/2018    No results found for: PSA  No results found for: TESTOSTERONE  Lab Results  Component Value Date   HGBA1C 11.4 (H) 03/30/2013    Urinalysis    Component Value Date/Time   COLORURINE YELLOW 03/17/2018 1458   APPEARANCEUR HAZY (A) 03/17/2018 1458   APPEARANCEUR Cloudy 03/29/2013 2030   LABSPEC 1.015 03/17/2018 1458   LABSPEC 1.011 03/29/2013 2030   PHURINE 8.5 (H) 03/17/2018 1458   GLUCOSEU NEGATIVE 03/17/2018 1458   GLUCOSEU >=500 03/29/2013 2030   HGBUR TRACE (A) 03/17/2018 1458   BILIRUBINUR NEGATIVE 03/17/2018 1458   BILIRUBINUR Negative 03/29/2013 2030   KETONESUR NEGATIVE 03/17/2018 1458   PROTEINUR >300 (A) 03/17/2018 1458   NITRITE NEGATIVE 03/17/2018 1458   LEUKOCYTESUR SMALL (A) 03/17/2018 1458   LEUKOCYTESUR 3+ 03/29/2013 2030    Lab Results  Component Value Date   BACTERIA FEW (A) 03/17/2018    Pertinent Imaging: *** No results found for this or any previous visit. No results found for this or any previous visit. No results found for this or any previous visit. No results found for this or any previous visit. No results found for this or any previous visit. No results found for this or any previous visit. No results found for this or any previous visit. No results found for this or any previous visit.  Assessment & Plan:    There are no diagnoses linked to this encounter.  Return in about 2 weeks (around 03/17/2018) for KUB.  Vanna Scotland, MD  [facility] [address], [address] [place], [place] [address] [phone]

## 2018-03-06 LAB — CULTURE, BLOOD (ROUTINE X 2)
CULTURE: NO GROWTH
CULTURE: NO GROWTH
SPECIAL REQUESTS: ADEQUATE

## 2018-03-16 ENCOUNTER — Other Ambulatory Visit: Payer: Self-pay

## 2018-03-16 DIAGNOSIS — N200 Calculus of kidney: Secondary | ICD-10-CM

## 2018-03-17 ENCOUNTER — Ambulatory Visit (INDEPENDENT_AMBULATORY_CARE_PROVIDER_SITE_OTHER): Payer: Medicare (Managed Care) | Admitting: Urology

## 2018-03-17 ENCOUNTER — Encounter: Payer: Self-pay | Admitting: Urology

## 2018-03-17 ENCOUNTER — Ambulatory Visit
Admission: RE | Admit: 2018-03-17 | Discharge: 2018-03-17 | Disposition: A | Discharge status: Home / Self Care | Payer: Medicare (Managed Care) | Source: Ambulatory Visit | Attending: Urology | Admitting: Urology

## 2018-03-17 ENCOUNTER — Other Ambulatory Visit
Admission: RE | Admit: 2018-03-17 | Discharge: 2018-03-17 | Disposition: A | Discharge status: Home / Self Care | Payer: Medicare (Managed Care) | Source: Ambulatory Visit | Attending: Urology | Admitting: Urology

## 2018-03-17 VITALS — BP 121/65 | HR 58

## 2018-03-17 DIAGNOSIS — N200 Calculus of kidney: Secondary | ICD-10-CM

## 2018-03-17 DIAGNOSIS — N201 Calculus of ureter: Secondary | ICD-10-CM

## 2018-03-17 DIAGNOSIS — N1330 Unspecified hydronephrosis: Secondary | ICD-10-CM

## 2018-03-17 LAB — URINALYSIS, COMPLETE (UACMP) WITH MICROSCOPIC
Bilirubin Urine: NEGATIVE
Glucose, UA: NEGATIVE mg/dL
Ketones, ur: NEGATIVE mg/dL
Nitrite: NEGATIVE
PH: 8.5 — AB (ref 5.0–8.0)
Protein, ur: >300 mg/dL — AB
SPECIFIC GRAVITY, URINE: 1.015 (ref 1.005–1.030)
WBC, UA: >50 WBC/hpf (ref 0–5)

## 2018-03-17 NOTE — Progress Notes (Signed)
03/17/2018 3:59 PM   Nathan Freeman May 03, 1951 [id number]  Referring provider: Inc, [facility] [address] [place], [place] [address]  Chief Complaint  Patient presents with  . Nephrolithiasis    2wk w/KUB    HPI: 68 year old male with end-stage renal disease on hemodialysis she returns today with 2-week follow-up with KUB.  He was last seen and evaluated 2 weeks ago with an obstructing left distal ureteral calculus and a UTI.  He elected to proceed with medical expulsive therapy and returns today with KUB.  He is accompanied today by caretaker and his daughter again.  He reports that about 2 to 3 days ago, he had an episode of pain and passed some small sandy gravelly type material but is unsure whether this was the stone.  He voids very little due to his hemodialysis.  KUB today shows absence of the stone seen 2 weeks ago.    Overall today, he is doing well.  No fevers or chills.  No flank pain.  No dysuria or gross hematuria.    PMH: Past Medical History:  Diagnosis Date  . Acute pericarditis    Probable, Echo (7/10) showed mild LVH, normal LV size and systolic function, EF 55-60%, normal wall motion, mild MR, no AS, partially loculated small effusion along the RA free wall  . Anemia   . CHF (congestive heart failure) (HCC)   . DDD (degenerative disc disease), lumbar    Spine  . Depression   . Diabetes mellitus type II    Poor control due to medication noncompliance. Last hgbA1c 13.2  . Edema   . Hepatitis, unspecified   . Hyperlipidemia   . Hyperparathyroidism (HCC)   . Hypertension    Cough with a BP med in teh past, sounds like ACEI  . Kidney stones    dialysis t,t,s  . Malnutrition (HCC)   . Sleep apnea    cpap  . Stroke [facility])    02/2013 left side weakness    Surgical History: Past Surgical History:  Procedure Laterality Date  . AV FISTULA PLACEMENT Left 12/11/2015   Procedure: ARTERIOVENOUS (AV) FISTULA CREATION ,BRACHIOCEPHALIC;   Surgeon: Annice Needy, MD;  Location: [facility] ORS;  Service: Vascular;  Laterality: Left;  . COLONOSCOPY    . COLONOSCOPY WITH PROPOFOL N/A 05/17/2016   Procedure: COLONOSCOPY WITH PROPOFOL;  Surgeon: Christena Deem, MD;  Location: [facility] ENDOSCOPY;  Service: Endoscopy;  Laterality: N/A;  . INSERTION OF DIALYSIS CATHETER Right    chest  . LITHOTRIPSY    . ROTATOR CUFF REPAIR     Left shoulder, 2 times  . TONSILLECTOMY      Home Medications:  Allergies as of 03/17/2018   No Known Allergies     Medication List        Accurate as of 03/17/18  3:59 PM. Always use your most recent med list.          acetaminophen 650 MG CR tablet Commonly known as:  TYLENOL Take 1,300 mg by mouth 2 (two) times daily as needed for pain (back pain).   albuterol (2.5 MG/3ML) 0.083% nebulizer solution Commonly known as:  PROVENTIL Take 2.5 mg by nebulization every 6 (six) hours as needed for wheezing or shortness of breath.   amLODipine 5 MG tablet Commonly known as:  NORVASC Take 5 mg by mouth every Tuesday, Thursday, Saturday, and Sunday.   aspirin 81 MG EC tablet Take 81 mg by mouth daily.   buPROPion 150 MG  24 hr tablet Commonly known as:  WELLBUTRIN XL Take 150 mg by mouth daily.   calcitRIOL 0.25 MCG capsule Commonly known as:  ROCALTROL Take 0.25 mcg by mouth 3 (three) times a week. On dialysis days   carvedilol 25 MG tablet Commonly known as:  COREG Take 25 mg by mouth 2 (two) times daily with a meal.   chlorpheniramine-HYDROcodone 10-8 MG/5ML Suer Commonly known as:  TUSSIONEX Take 5 mLs by mouth every 12 (twelve) hours as needed for cough.   citalopram 40 MG tablet Commonly known as:  CELEXA Take 40 mg by mouth daily.   docusate sodium 100 MG capsule Commonly known as:  COLACE Take 100 mg by mouth daily as needed for mild constipation.   fish oil-omega-3 fatty acids 1000 MG capsule Take 2 capsules by mouth daily with supper.   guaiFENesin 600 MG 12 hr tablet Commonly  known as:  MUCINEX Take 600 mg by mouth 2 (two) times daily as needed for cough.   HYDROcodone-acetaminophen 5-325 MG tablet Commonly known as:  NORCO Take 1 tablet by mouth every 6 (six) hours as needed for moderate pain.   lisinopril 10 MG tablet Commonly known as:  PRINIVIL,ZESTRIL Take 10 mg by mouth daily.   ondansetron 4 MG disintegrating tablet Commonly known as:  ZOFRAN ODT Take 1 tablet (4 mg total) by mouth every 8 (eight) hours as needed for nausea or vomiting.   oxyCODONE-acetaminophen 5-325 MG tablet Commonly known as:  PERCOCET Take 1 tablet by mouth every 4 (four) hours as needed for severe pain.   pantoprazole 40 MG tablet Commonly known as:  PROTONIX Take 40 mg by mouth daily.   polycarbophil 625 MG tablet Commonly known as:  FIBERCON Take 625 mg by mouth 2 (two) times daily.   predniSONE 10 MG (21) Tbpk tablet Commonly known as:  STERAPRED UNI-PAK 21 TAB 6 tabs day 1 and taper 10 mg a day - 6 days   ranitidine 150 MG tablet Commonly known as:  ZANTAC Take 150 mg by mouth 2 (two) times daily.   rosuvastatin 10 MG tablet Commonly known as:  CRESTOR Take 10 mg by mouth daily.   sevelamer carbonate 800 MG tablet Commonly known as:  RENVELA Take 800 mg by mouth 3 (three) times daily with meals.   thiamine 100 MG tablet Take by mouth.   traMADol 50 MG tablet Commonly known as:  ULTRAM Take 50 mg by mouth every 6 (six) hours as needed for moderate pain (back pain).       Allergies: No Known Allergies  Family History: Family History  Problem Relation Age of Onset  . Heart failure Mother   . Ulcers Mother        Venous ulcers  . Heart failure Father   . Heart attack Brother        3 heart attacks    Social History:  reports that he quit smoking about 45 years ago. His smoking use included cigarettes. He has never used smokeless tobacco. He reports that he does not drink alcohol or use drugs.  ROS: UROLOGY Frequent Urination?: Yes Hard to  postpone urination?: No Burning/pain with urination?: No Get up at night to urinate?: Yes Leakage of urine?: Yes Urine stream starts and stops?: Yes Trouble starting stream?: No Do you have to strain to urinate?: Yes Blood in urine?: Yes Urinary tract infection?: No Sexually transmitted disease?: No Injury to kidneys or bladder?: No Painful intercourse?: No Weak stream?: No Erection problems?: No  Penile pain?: No  Gastrointestinal Nausea?: No Vomiting?: No Indigestion/heartburn?: No Diarrhea?: Yes Constipation?: No  Constitutional Fever: Yes Night sweats?: Yes Weight loss?: Yes Fatigue?: Yes  Skin Skin rash/lesions?: No Itching?: No  Eyes Blurred vision?: No Double vision?: No  Ears/Nose/Throat Sore throat?: No Sinus problems?: Yes  Hematologic/Lymphatic Swollen glands?: No Easy bruising?: No  Cardiovascular Leg swelling?: Yes Chest pain?: No  Respiratory Cough?: Yes Shortness of breath?: No  Endocrine Excessive thirst?: No  Musculoskeletal Back pain?: Yes Joint pain?: No  Neurological Headaches?: Yes Dizziness?: Yes  Psychologic Depression?: Yes Anxiety?: No  Physical Exam: BP 121/65   Pulse (!) 58   Constitutional:  Alert and oriented, No acute distress. In wheel chair accompanied by caretaker and daughter. HEENT: [place] AT, moist mucus membranes.  Trachea midline, no masses. Cardiovascular: No clubbing, cyanosis, or edema. Respiratory: Normal respiratory effort, no increased work of breathing. Skin: No rashes, bruises or suspicious lesions. Neurologic: Grossly intact, no focal deficits, moving all 4 extremities. Psychiatric: Normal mood and affect.  Laboratory Data: Lab Results  Component Value Date   WBC 14.9 (H) 03/01/2018   HGB 10.6 (L) 03/01/2018   HCT 31.1 (L) 03/01/2018   MCV 96.2 03/01/2018   PLT 201 03/01/2018    Lab Results  Component Value Date   CREATININE 2.28 (H) 03/01/2018    Lab Results  Component Value Date     HGBA1C 11.4 (H) 03/30/2013    Urinalysis Results for orders placed or performed during the hospital encounter of 03/17/18  Urinalysis, Complete w Microscopic  Result Value Ref Range   Color, Urine YELLOW YELLOW   APPearance HAZY (A) CLEAR   Specific Gravity, Urine 1.015 1.005 - 1.030   pH 8.5 (H) 5.0 - 8.0   Glucose, UA NEGATIVE NEGATIVE mg/dL   Hgb urine dipstick TRACE (A) NEGATIVE   Bilirubin Urine NEGATIVE NEGATIVE   Ketones, ur NEGATIVE NEGATIVE mg/dL   Protein, ur >161 (A) NEGATIVE mg/dL   Nitrite NEGATIVE NEGATIVE   Leukocytes, UA SMALL (A) NEGATIVE   Squamous Epithelial / LPF 0-5 0 - 5   WBC, UA >50 0 - 5 WBC/hpf   RBC / HPF 0-5 0 - 5 RBC/hpf   Bacteria, UA FEW (A) NONE SEEN   WBC Clumps PRESENT     Pertinent Imaging: CLINICAL DATA:  Nephrolithiasis.  EXAM: ABDOMEN - 1 VIEW  COMPARISON:  Abdominal x-ray dated March 03, 2018.  FINDINGS: Unchanged small right renal calculi. The oval calcification in the pelvis seen on prior study is no longer identified. Nonobstructive bowel gas pattern. No acute osseous abnormality. Vascular calcifications.  IMPRESSION: 1. Oval calcification in the pelvis seen on prior study is no longer identified and likely represents a now passed calculus. 2. Unchanged right nephrolithiasis.   Electronically Signed   By: Obie Dredge M.D.   On: 03/18/2018 23:47  KUB personally reviewed today and compared to KUB from 2 weeks ago, agree that appears stone may have passed if previously pelvic calcification represents ureteral stone on OSH imagin  Assessment & Plan:    1. Left ureteral stone Appears stone may have passed as above Warning symptoms reviewed again today - US RENAL; Future  2. Hydronephrosis, left Plan for RUS to assess if hydronephrosis has resolved Concerned that KUB may not be accurate If hydronephrosis persists, will likely arrange for CT scan and operative interventions Patient and daughter aggreable  with the plan   Will call with RUS results   Vanna Scotland, MD  [facility] [address], [place], [place] [address] [phone]

## 2018-03-19 ENCOUNTER — Encounter: Payer: Self-pay | Admitting: Urology

## 2018-03-27 ENCOUNTER — Ambulatory Visit
Admission: RE | Admit: 2018-03-27 | Discharge: 2018-03-27 | Disposition: A | Discharge status: Home / Self Care | Payer: Medicare (Managed Care) | Source: Ambulatory Visit | Attending: Urology | Admitting: Urology

## 2018-03-27 DIAGNOSIS — N201 Calculus of ureter: Secondary | ICD-10-CM | POA: Diagnosis present

## 2018-03-27 DIAGNOSIS — N281 Cyst of kidney, acquired: Secondary | ICD-10-CM | POA: Insufficient documentation

## 2018-04-03 ENCOUNTER — Telehealth: Payer: Self-pay

## 2018-04-03 DIAGNOSIS — N281 Cyst of kidney, acquired: Secondary | ICD-10-CM

## 2018-04-03 NOTE — Telephone Encounter (Signed)
Patient's daughter notified and order was placed Please schedule for 1 year

## 2018-04-03 NOTE — Telephone Encounter (Signed)
-----   Message from Vanna ScotlandAshley [name], MD sent at 04/02/2018  3:50 PM EDT ----- Please call this patient's daughter as well as to let this patient know that his renal ultrasound looks like he indeed passed the stone.  Awesome news.  He does have a renal cyst that probably should be followed (low risk for cancer, small but could evolve), would recommend another renal ultrasound in 1 year.  Please place the order and arrange annual follow-up.  Vanna ScotlandAshley [name], MD

## 2018-04-04 NOTE — Telephone Encounter (Signed)
Spoke with [facility] and they have to schedule all of his appointments, they will have Ebony call the office back to schedule his 1 year follow up app and then they will arrange for him to have the RUS prior to this in a year.   Nathan Freeman

## 2018-04-08 ENCOUNTER — Inpatient Hospital Stay: Payer: Medicare (Managed Care)

## 2018-04-08 ENCOUNTER — Other Ambulatory Visit: Payer: Self-pay

## 2018-04-08 ENCOUNTER — Encounter
Admission: EM | Disposition: A | Discharge status: Skilled Nursing Facility | Payer: Self-pay | Source: Home / Self Care | Attending: Internal Medicine

## 2018-04-08 ENCOUNTER — Inpatient Hospital Stay: Payer: Medicare (Managed Care) | Admitting: Anesthesiology

## 2018-04-08 ENCOUNTER — Emergency Department: Payer: Medicare (Managed Care)

## 2018-04-08 ENCOUNTER — Inpatient Hospital Stay
Admission: EM | Admit: 2018-04-08 | Discharge: 2018-04-11 | DRG: 480 | Disposition: A | Discharge status: Skilled Nursing Facility | Payer: Medicare (Managed Care) | Attending: Internal Medicine | Admitting: Internal Medicine

## 2018-04-08 DIAGNOSIS — Z87442 Personal history of urinary calculi: Secondary | ICD-10-CM

## 2018-04-08 DIAGNOSIS — Z992 Dependence on renal dialysis: Secondary | ICD-10-CM | POA: Diagnosis not present

## 2018-04-08 DIAGNOSIS — M7732 Calcaneal spur, left foot: Secondary | ICD-10-CM | POA: Diagnosis present

## 2018-04-08 DIAGNOSIS — Z7982 Long term (current) use of aspirin: Secondary | ICD-10-CM | POA: Diagnosis not present

## 2018-04-08 DIAGNOSIS — I132 Hypertensive heart and chronic kidney disease with heart failure and with stage 5 chronic kidney disease, or end stage renal disease: Secondary | ICD-10-CM | POA: Diagnosis present

## 2018-04-08 DIAGNOSIS — G4733 Obstructive sleep apnea (adult) (pediatric): Secondary | ICD-10-CM | POA: Diagnosis present

## 2018-04-08 DIAGNOSIS — N186 End stage renal disease: Secondary | ICD-10-CM | POA: Diagnosis present

## 2018-04-08 DIAGNOSIS — M25572 Pain in left ankle and joints of left foot: Secondary | ICD-10-CM | POA: Diagnosis not present

## 2018-04-08 DIAGNOSIS — J181 Lobar pneumonia, unspecified organism: Secondary | ICD-10-CM | POA: Diagnosis present

## 2018-04-08 DIAGNOSIS — Z8249 Family history of ischemic heart disease and other diseases of the circulatory system: Secondary | ICD-10-CM | POA: Diagnosis not present

## 2018-04-08 DIAGNOSIS — I5043 Acute on chronic combined systolic (congestive) and diastolic (congestive) heart failure: Secondary | ICD-10-CM | POA: Diagnosis not present

## 2018-04-08 DIAGNOSIS — N2581 Secondary hyperparathyroidism of renal origin: Secondary | ICD-10-CM | POA: Diagnosis present

## 2018-04-08 DIAGNOSIS — J9601 Acute respiratory failure with hypoxia: Secondary | ICD-10-CM | POA: Diagnosis not present

## 2018-04-08 DIAGNOSIS — E1122 Type 2 diabetes mellitus with diabetic chronic kidney disease: Secondary | ICD-10-CM | POA: Diagnosis present

## 2018-04-08 DIAGNOSIS — M79673 Pain in unspecified foot: Secondary | ICD-10-CM

## 2018-04-08 DIAGNOSIS — S72142A Displaced intertrochanteric fracture of left femur, initial encounter for closed fracture: Secondary | ICD-10-CM | POA: Diagnosis present

## 2018-04-08 DIAGNOSIS — I69354 Hemiplegia and hemiparesis following cerebral infarction affecting left non-dominant side: Secondary | ICD-10-CM

## 2018-04-08 DIAGNOSIS — S72002A Fracture of unspecified part of neck of left femur, initial encounter for closed fracture: Secondary | ICD-10-CM

## 2018-04-08 DIAGNOSIS — F0390 Unspecified dementia without behavioral disturbance: Secondary | ICD-10-CM | POA: Diagnosis present

## 2018-04-08 DIAGNOSIS — R7981 Abnormal blood-gas level: Secondary | ICD-10-CM

## 2018-04-08 DIAGNOSIS — Z87891 Personal history of nicotine dependence: Secondary | ICD-10-CM

## 2018-04-08 DIAGNOSIS — E785 Hyperlipidemia, unspecified: Secondary | ICD-10-CM | POA: Diagnosis present

## 2018-04-08 DIAGNOSIS — D631 Anemia in chronic kidney disease: Secondary | ICD-10-CM | POA: Diagnosis present

## 2018-04-08 DIAGNOSIS — F329 Major depressive disorder, single episode, unspecified: Secondary | ICD-10-CM | POA: Diagnosis present

## 2018-04-08 DIAGNOSIS — S72009A Fracture of unspecified part of neck of unspecified femur, initial encounter for closed fracture: Secondary | ICD-10-CM | POA: Diagnosis present

## 2018-04-08 DIAGNOSIS — J189 Pneumonia, unspecified organism: Secondary | ICD-10-CM

## 2018-04-08 DIAGNOSIS — Y92002 Bathroom of unspecified non-institutional (private) residence single-family (private) house as the place of occurrence of the external cause: Secondary | ICD-10-CM | POA: Diagnosis not present

## 2018-04-08 DIAGNOSIS — W010XXA Fall on same level from slipping, tripping and stumbling without subsequent striking against object, initial encounter: Secondary | ICD-10-CM | POA: Diagnosis present

## 2018-04-08 HISTORY — PX: FEMUR IM NAIL: SHX1597

## 2018-04-08 LAB — GLUCOSE, CAPILLARY: Glucose-Capillary: 166 mg/dL — ABNORMAL HIGH (ref 70–99)

## 2018-04-08 LAB — COMPREHENSIVE METABOLIC PANEL
ALT: 19 U/L (ref 0–44)
AST: 25 U/L (ref 15–41)
Albumin: 3.5 g/dL (ref 3.5–5.0)
Alkaline Phosphatase: 95 U/L (ref 38–126)
Anion gap: 11 (ref 5–15)
BUN: 25 mg/dL — ABNORMAL HIGH (ref 8–23)
CHLORIDE: 99 mmol/L (ref 98–111)
CO2: 28 mmol/L (ref 22–32)
CREATININE: 2.6 mg/dL — AB (ref 0.61–1.24)
Calcium: 9 mg/dL (ref 8.9–10.3)
GFR, EST AFRICAN AMERICAN: 28 mL/min — AB (ref >60)
GFR, EST NON AFRICAN AMERICAN: 24 mL/min — AB (ref >60)
Glucose, Bld: 152 mg/dL — ABNORMAL HIGH (ref 70–99)
POTASSIUM: 4 mmol/L (ref 3.5–5.1)
SODIUM: 138 mmol/L (ref 135–145)
Total Bilirubin: 1 mg/dL (ref 0.3–1.2)
Total Protein: 7.1 g/dL (ref 6.5–8.1)

## 2018-04-08 LAB — SURGICAL PCR SCREEN
MRSA, PCR: NEGATIVE
STAPHYLOCOCCUS AUREUS: POSITIVE — AB

## 2018-04-08 LAB — CBC WITH DIFFERENTIAL/PLATELET
Basophils Absolute: 0 10*3/uL (ref 0–0.1)
Basophils Relative: 1 %
Eosinophils Absolute: 0.1 10*3/uL (ref 0–0.7)
Eosinophils Relative: 1 %
HCT: 32.6 % — ABNORMAL LOW (ref 40.0–52.0)
HEMOGLOBIN: 11 g/dL — AB (ref 13.0–18.0)
LYMPHS ABS: 0.4 10*3/uL — AB (ref 1.0–3.6)
LYMPHS PCT: 8 %
MCH: 32.6 pg (ref 26.0–34.0)
MCHC: 33.9 g/dL (ref 32.0–36.0)
MCV: 96.3 fL (ref 80.0–100.0)
MONOS PCT: 11 %
Monocytes Absolute: 0.6 10*3/uL (ref 0.2–1.0)
NEUTROS PCT: 79 %
Neutro Abs: 4.5 10*3/uL (ref 1.4–6.5)
Platelets: 198 10*3/uL (ref 150–440)
RBC: 3.38 MIL/uL — AB (ref 4.40–5.90)
RDW: 16 % — ABNORMAL HIGH (ref 11.5–14.5)
WBC: 5.6 10*3/uL (ref 3.8–10.6)

## 2018-04-08 LAB — BASIC METABOLIC PANEL
Anion gap: 11 (ref 5–15)
BUN: 26 mg/dL — ABNORMAL HIGH (ref 8–23)
CHLORIDE: 99 mmol/L (ref 98–111)
CO2: 26 mmol/L (ref 22–32)
Calcium: 9 mg/dL (ref 8.9–10.3)
Creatinine, Ser: 2.77 mg/dL — ABNORMAL HIGH (ref 0.61–1.24)
GFR calc Af Amer: 26 mL/min — ABNORMAL LOW (ref >60)
GFR, EST NON AFRICAN AMERICAN: 22 mL/min — AB (ref >60)
Glucose, Bld: 152 mg/dL — ABNORMAL HIGH (ref 70–99)
Potassium: 4.9 mmol/L (ref 3.5–5.1)
SODIUM: 136 mmol/L (ref 135–145)

## 2018-04-08 LAB — TYPE AND SCREEN
ABO/RH(D): O POS
Antibody Screen: NEGATIVE

## 2018-04-08 LAB — CBC
HEMATOCRIT: 34.3 % — AB (ref 40.0–52.0)
HEMOGLOBIN: 11.3 g/dL — AB (ref 13.0–18.0)
MCH: 32.7 pg (ref 26.0–34.0)
MCHC: 33.1 g/dL (ref 32.0–36.0)
MCV: 99 fL (ref 80.0–100.0)
Platelets: 222 10*3/uL (ref 150–440)
RBC: 3.46 MIL/uL — AB (ref 4.40–5.90)
RDW: 16.5 % — ABNORMAL HIGH (ref 11.5–14.5)
WBC: 13.8 10*3/uL — AB (ref 3.8–10.6)

## 2018-04-08 SURGERY — INSERTION, INTRAMEDULLARY ROD, FEMUR
Anesthesia: General | Laterality: Left

## 2018-04-08 MED ORDER — METHOCARBAMOL 500 MG PO TABS
500.0000 mg | ORAL_TABLET | Freq: Four times a day (QID) | ORAL | Status: DC | PRN
  Administered 2018-04-08 – 2018-04-09 (×5): 500 mg via ORAL
  Filled 2018-04-08 (×5): qty 1

## 2018-04-08 MED ORDER — PROPOFOL 10 MG/ML IV BOLUS
INTRAVENOUS | Status: DC | PRN
  Administered 2018-04-08: 120 mg via INTRAVENOUS

## 2018-04-08 MED ORDER — FENTANYL CITRATE (PF) 100 MCG/2ML IJ SOLN
100.0000 ug | Freq: Once | INTRAMUSCULAR | Status: AC
  Administered 2018-04-08: 100 ug via INTRAVENOUS
  Filled 2018-04-08: qty 2

## 2018-04-08 MED ORDER — ROCURONIUM BROMIDE 100 MG/10ML IV SOLN
INTRAVENOUS | Status: DC | PRN
  Administered 2018-04-08: 30 mg via INTRAVENOUS

## 2018-04-08 MED ORDER — ONDANSETRON HCL 4 MG/2ML IJ SOLN: 4.0000 mg | Freq: Four times a day (QID) | INTRAMUSCULAR | Status: DC | PRN

## 2018-04-08 MED ORDER — OMEGA-3-ACID ETHYL ESTERS 1 G PO CAPS
2.0000 | ORAL_CAPSULE | Freq: Every day | ORAL | Status: DC
  Administered 2018-04-11: 2 g via ORAL
  Filled 2018-04-08 (×2): qty 2

## 2018-04-08 MED ORDER — MENTHOL 3 MG MT LOZG
1.0000 | LOZENGE | OROMUCOSAL | Status: DC | PRN
  Filled 2018-04-08: qty 9

## 2018-04-08 MED ORDER — CARVEDILOL 25 MG PO TABS
25.0000 mg | ORAL_TABLET | Freq: Two times a day (BID) | ORAL | Status: DC
  Administered 2018-04-08 – 2018-04-11 (×7): 25 mg via ORAL
  Filled 2018-04-08 (×7): qty 1

## 2018-04-08 MED ORDER — DOCUSATE SODIUM 100 MG PO CAPS: 100.0000 mg | ORAL_CAPSULE | Freq: Two times a day (BID) | ORAL | Status: DC

## 2018-04-08 MED ORDER — LISINOPRIL 10 MG PO TABS
10.0000 mg | ORAL_TABLET | Freq: Every day | ORAL | Status: DC
  Administered 2018-04-09 – 2018-04-11 (×2): 10 mg via ORAL
  Filled 2018-04-08 (×3): qty 1

## 2018-04-08 MED ORDER — PANTOPRAZOLE SODIUM 40 MG PO TBEC
40.0000 mg | DELAYED_RELEASE_TABLET | Freq: Every day | ORAL | Status: DC
  Administered 2018-04-09 – 2018-04-11 (×3): 40 mg via ORAL
  Filled 2018-04-08 (×3): qty 1

## 2018-04-08 MED ORDER — SUCCINYLCHOLINE CHLORIDE 20 MG/ML IJ SOLN
INTRAMUSCULAR | Status: DC | PRN
  Administered 2018-04-08: 100 mg via INTRAVENOUS

## 2018-04-08 MED ORDER — ONDANSETRON HCL 4 MG PO TABS: 4.0000 mg | ORAL_TABLET | Freq: Four times a day (QID) | ORAL | Status: DC | PRN

## 2018-04-08 MED ORDER — BISACODYL 10 MG RE SUPP
10.0000 mg | Freq: Every day | RECTAL | Status: DC | PRN
  Administered 2018-04-11: 10 mg via RECTAL
  Filled 2018-04-08 (×2): qty 1

## 2018-04-08 MED ORDER — SODIUM CHLORIDE 0.9 % IV SOLN
Freq: Once | INTRAVENOUS | Status: AC
  Administered 2018-04-08: 03:00:00 via INTRAVENOUS

## 2018-04-08 MED ORDER — ONDANSETRON HCL 4 MG/2ML IJ SOLN
4.0000 mg | Freq: Once | INTRAMUSCULAR | Status: AC
  Administered 2018-04-08: 4 mg via INTRAVENOUS
  Filled 2018-04-08: qty 2

## 2018-04-08 MED ORDER — BUPIVACAINE HCL (PF) 0.5 % IJ SOLN
30.0000 mL | Freq: Once | INTRAMUSCULAR | Status: AC
  Administered 2018-04-08: 30 mL
  Filled 2018-04-08: qty 30

## 2018-04-08 MED ORDER — LACTATED RINGERS IV SOLN
INTRAVENOUS | Status: DC | PRN
  Administered 2018-04-08: 09:00:00 via INTRAVENOUS

## 2018-04-08 MED ORDER — MORPHINE SULFATE (PF) 4 MG/ML IV SOLN
6.0000 mg | Freq: Once | INTRAVENOUS | Status: AC
  Administered 2018-04-08: 6 mg via INTRAVENOUS
  Filled 2018-04-08: qty 2

## 2018-04-08 MED ORDER — BUPROPION HCL ER (XL) 150 MG PO TB24
150.0000 mg | ORAL_TABLET | Freq: Every day | ORAL | Status: DC
  Administered 2018-04-09 – 2018-04-11 (×3): 150 mg via ORAL
  Filled 2018-04-08 (×3): qty 1

## 2018-04-08 MED ORDER — PROPOFOL 500 MG/50ML IV EMUL
INTRAVENOUS | Status: AC
  Filled 2018-04-08: qty 50

## 2018-04-08 MED ORDER — FENTANYL CITRATE (PF) 100 MCG/2ML IJ SOLN
INTRAMUSCULAR | Status: AC
  Filled 2018-04-08: qty 2

## 2018-04-08 MED ORDER — AMLODIPINE BESYLATE 10 MG PO TABS
10.0000 mg | ORAL_TABLET | Freq: Every day | ORAL | Status: DC
  Administered 2018-04-08 – 2018-04-11 (×3): 10 mg via ORAL
  Filled 2018-04-08 (×4): qty 1

## 2018-04-08 MED ORDER — BISACODYL 5 MG PO TBEC
5.0000 mg | DELAYED_RELEASE_TABLET | Freq: Every day | ORAL | Status: DC | PRN
  Administered 2018-04-11: 5 mg via ORAL
  Filled 2018-04-08: qty 1

## 2018-04-08 MED ORDER — BUPIVACAINE HCL (PF) 0.5 % IJ SOLN
INTRAMUSCULAR | Status: AC
  Filled 2018-04-08: qty 30

## 2018-04-08 MED ORDER — METOCLOPRAMIDE HCL 5 MG/ML IJ SOLN: 5.0000 mg | Freq: Three times a day (TID) | INTRAMUSCULAR | Status: DC | PRN

## 2018-04-08 MED ORDER — HYDROCODONE-ACETAMINOPHEN 5-325 MG PO TABS
1.0000 | ORAL_TABLET | ORAL | Status: DC | PRN
  Administered 2018-04-08 – 2018-04-09 (×6): 1 via ORAL
  Administered 2018-04-10: 2 via ORAL
  Administered 2018-04-10: 1 via ORAL
  Filled 2018-04-08 (×3): qty 1
  Filled 2018-04-08: qty 2
  Filled 2018-04-08 (×2): qty 1

## 2018-04-08 MED ORDER — VITAMIN B-1 100 MG PO TABS
100.0000 mg | ORAL_TABLET | Freq: Every day | ORAL | Status: DC
  Administered 2018-04-09 – 2018-04-11 (×3): 100 mg via ORAL
  Filled 2018-04-08 (×3): qty 1

## 2018-04-08 MED ORDER — ONDANSETRON HCL 4 MG/2ML IJ SOLN: 4.0000 mg | Freq: Once | INTRAMUSCULAR | Status: DC | PRN

## 2018-04-08 MED ORDER — ASPIRIN EC 325 MG PO TBEC
325.0000 mg | DELAYED_RELEASE_TABLET | Freq: Every day | ORAL | Status: DC
  Administered 2018-04-09 – 2018-04-11 (×3): 325 mg via ORAL
  Filled 2018-04-08 (×3): qty 1

## 2018-04-08 MED ORDER — FENTANYL CITRATE (PF) 100 MCG/2ML IJ SOLN
25.0000 ug | INTRAMUSCULAR | Status: DC | PRN
  Administered 2018-04-08 (×2): 25 ug via INTRAVENOUS

## 2018-04-08 MED ORDER — TRAZODONE HCL 50 MG PO TABS: 25.0000 mg | ORAL_TABLET | Freq: Every evening | ORAL | Status: DC | PRN

## 2018-04-08 MED ORDER — CEFAZOLIN SODIUM-DEXTROSE 2-3 GM-%(50ML) IV SOLR
INTRAVENOUS | Status: DC | PRN
  Administered 2018-04-08: 2 g via INTRAVENOUS

## 2018-04-08 MED ORDER — CITALOPRAM HYDROBROMIDE 20 MG PO TABS
20.0000 mg | ORAL_TABLET | Freq: Every day | ORAL | Status: DC
  Administered 2018-04-09 – 2018-04-11 (×3): 20 mg via ORAL
  Filled 2018-04-08 (×3): qty 1

## 2018-04-08 MED ORDER — ACETAMINOPHEN 650 MG RE SUPP: 650.0000 mg | Freq: Four times a day (QID) | RECTAL | Status: DC | PRN

## 2018-04-08 MED ORDER — ACETAMINOPHEN 500 MG PO TABS
500.0000 mg | ORAL_TABLET | Freq: Four times a day (QID) | ORAL | Status: AC
  Administered 2018-04-08: 500 mg via ORAL
  Filled 2018-04-08: qty 1

## 2018-04-08 MED ORDER — PHENOL 1.4 % MT LIQD
1.0000 | OROMUCOSAL | Status: DC | PRN
  Filled 2018-04-08: qty 177

## 2018-04-08 MED ORDER — MORPHINE SULFATE (PF) 2 MG/ML IV SOLN: 0.5000 mg | INTRAVENOUS | Status: DC | PRN

## 2018-04-08 MED ORDER — SENNOSIDES-DOCUSATE SODIUM 8.6-50 MG PO TABS
1.0000 | ORAL_TABLET | Freq: Every day | ORAL | Status: DC
  Administered 2018-04-08 – 2018-04-10 (×3): 1 via ORAL
  Filled 2018-04-08 (×3): qty 1

## 2018-04-08 MED ORDER — FUROSEMIDE 10 MG/ML IJ SOLN
20.0000 mg | Freq: Once | INTRAMUSCULAR | Status: AC
  Administered 2018-04-08: 20 mg via INTRAVENOUS
  Filled 2018-04-08: qty 4

## 2018-04-08 MED ORDER — NEOMYCIN-POLYMYXIN B GU 40-200000 IR SOLN
Status: AC
  Filled 2018-04-08: qty 2

## 2018-04-08 MED ORDER — SUGAMMADEX SODIUM 500 MG/5ML IV SOLN
INTRAVENOUS | Status: DC | PRN
  Administered 2018-04-08: 200 mg via INTRAVENOUS

## 2018-04-08 MED ORDER — METHOCARBAMOL 1000 MG/10ML IJ SOLN
500.0000 mg | Freq: Four times a day (QID) | INTRAVENOUS | Status: DC | PRN
  Filled 2018-04-08: qty 5

## 2018-04-08 MED ORDER — ROSUVASTATIN CALCIUM 10 MG PO TABS
10.0000 mg | ORAL_TABLET | Freq: Every day | ORAL | Status: DC
  Administered 2018-04-09 – 2018-04-11 (×3): 10 mg via ORAL
  Filled 2018-04-08 (×3): qty 1

## 2018-04-08 MED ORDER — ACETAMINOPHEN 325 MG PO TABS: 650.0000 mg | ORAL_TABLET | Freq: Four times a day (QID) | ORAL | Status: DC | PRN

## 2018-04-08 MED ORDER — HYDROCODONE-ACETAMINOPHEN 5-325 MG PO TABS
1.0000 | ORAL_TABLET | ORAL | Status: DC | PRN
  Administered 2018-04-11 (×2): 1 via ORAL
  Filled 2018-04-08 (×4): qty 1

## 2018-04-08 MED ORDER — HYDROMORPHONE HCL 1 MG/ML IJ SOLN
0.5000 mg | INTRAMUSCULAR | Status: DC | PRN
  Administered 2018-04-08: 0.5 mg via INTRAVENOUS
  Filled 2018-04-08: qty 1

## 2018-04-08 MED ORDER — MAGNESIUM HYDROXIDE 400 MG/5ML PO SUSP
30.0000 mL | Freq: Every day | ORAL | Status: DC | PRN
  Administered 2018-04-10: 30 mL via ORAL
  Filled 2018-04-08: qty 30

## 2018-04-08 MED ORDER — ACETAMINOPHEN 325 MG PO TABS: 325.0000 mg | ORAL_TABLET | Freq: Four times a day (QID) | ORAL | Status: DC | PRN

## 2018-04-08 MED ORDER — FLUTICASONE PROPIONATE 50 MCG/ACT NA SUSP
2.0000 | Freq: Every day | NASAL | Status: DC | PRN
Start: 2018-04-08 — End: 2018-04-11
  Filled 2018-04-08: qty 16

## 2018-04-08 MED ORDER — DOCUSATE SODIUM 100 MG PO CAPS: 100.0000 mg | ORAL_CAPSULE | Freq: Every day | ORAL | Status: DC | PRN

## 2018-04-08 MED ORDER — TRAMADOL HCL 50 MG PO TABS
50.0000 mg | ORAL_TABLET | Freq: Four times a day (QID) | ORAL | Status: DC | PRN
  Administered 2018-04-08 – 2018-04-10 (×2): 50 mg via ORAL
  Filled 2018-04-08 (×2): qty 1

## 2018-04-08 MED ORDER — ALUM & MAG HYDROXIDE-SIMETH 200-200-20 MG/5ML PO SUSP: 30.0000 mL | ORAL | Status: DC | PRN

## 2018-04-08 MED ORDER — PIPERACILLIN-TAZOBACTAM 3.375 G IVPB
3.3750 g | Freq: Two times a day (BID) | INTRAVENOUS | Status: DC
  Administered 2018-04-08 – 2018-04-11 (×6): 3.375 g via INTRAVENOUS
  Filled 2018-04-08 (×6): qty 50

## 2018-04-08 MED ORDER — CEFAZOLIN SODIUM-DEXTROSE 1-4 GM/50ML-% IV SOLN
1.0000 g | Freq: Four times a day (QID) | INTRAVENOUS | Status: DC
  Filled 2018-04-08 (×2): qty 50

## 2018-04-08 MED ORDER — DOCUSATE SODIUM 100 MG PO CAPS
100.0000 mg | ORAL_CAPSULE | Freq: Two times a day (BID) | ORAL | Status: DC
  Administered 2018-04-08 – 2018-04-11 (×6): 100 mg via ORAL
  Filled 2018-04-08 (×7): qty 1

## 2018-04-08 MED ORDER — ALBUTEROL SULFATE (2.5 MG/3ML) 0.083% IN NEBU: 2.5000 mg | INHALATION_SOLUTION | Freq: Four times a day (QID) | RESPIRATORY_TRACT | Status: DC | PRN

## 2018-04-08 MED ORDER — TAMSULOSIN HCL 0.4 MG PO CAPS
0.4000 mg | ORAL_CAPSULE | Freq: Every day | ORAL | Status: DC
  Administered 2018-04-09 – 2018-04-11 (×3): 0.4 mg via ORAL
  Filled 2018-04-08 (×3): qty 1

## 2018-04-08 MED ORDER — CEFAZOLIN (ANCEF) 1 G IV SOLR
1.0000 g | INTRAVENOUS | Status: DC
  Filled 2018-04-08: qty 1

## 2018-04-08 MED ORDER — LIDOCAINE HCL (CARDIAC) PF 100 MG/5ML IV SOSY
PREFILLED_SYRINGE | INTRAVENOUS | Status: DC | PRN
  Administered 2018-04-08: 50 mg via INTRAVENOUS

## 2018-04-08 MED ORDER — ALBUTEROL SULFATE (2.5 MG/3ML) 0.083% IN NEBU: 3.0000 mL | INHALATION_SOLUTION | RESPIRATORY_TRACT | Status: DC | PRN

## 2018-04-08 MED ORDER — SEVELAMER CARBONATE 800 MG PO TABS
800.0000 mg | ORAL_TABLET | Freq: Three times a day (TID) | ORAL | Status: DC
  Administered 2018-04-08 – 2018-04-11 (×7): 800 mg via ORAL
  Filled 2018-04-08 (×12): qty 1

## 2018-04-08 MED ORDER — MAGNESIUM CITRATE PO SOLN
1.0000 | Freq: Once | ORAL | Status: AC | PRN
  Administered 2018-04-11: 1 via ORAL
  Filled 2018-04-08 (×2): qty 296

## 2018-04-08 MED ORDER — ASPIRIN EC 81 MG PO TBEC: 81.0000 mg | DELAYED_RELEASE_TABLET | Freq: Every day | ORAL | Status: DC

## 2018-04-08 MED ORDER — METOCLOPRAMIDE HCL 10 MG PO TABS: 5.0000 mg | ORAL_TABLET | Freq: Three times a day (TID) | ORAL | Status: DC | PRN

## 2018-04-08 MED ORDER — CALCITRIOL 0.25 MCG PO CAPS
0.2500 ug | ORAL_CAPSULE | ORAL | Status: DC
  Administered 2018-04-10: 0.25 ug via ORAL
  Filled 2018-04-08: qty 1

## 2018-04-08 SURGICAL SUPPLY — 38 items
BIT DRILL CANN LG 4.3MM (BIT) ×1 IMPLANT
CANISTER SUCT 1200ML W/VALVE (MISCELLANEOUS) ×3 IMPLANT
CHLORAPREP W/TINT 26ML (MISCELLANEOUS) ×3 IMPLANT
DRAPE SHEET LG 3/4 BI-LAMINATE (DRAPES) ×3 IMPLANT
DRAPE SURG 17X11 SM STRL (DRAPES) ×3 IMPLANT
DRAPE U-SHAPE 47X51 STRL (DRAPES) ×3 IMPLANT
DRILL BIT CANN LG 4.3MM (BIT) ×3
DRSG OPSITE POSTOP 3X4 (GAUZE/BANDAGES/DRESSINGS) ×9 IMPLANT
ELECT CAUTERY BLADE 6.4 (BLADE) ×3 IMPLANT
ELECT REM PT RETURN 9FT ADLT (ELECTROSURGICAL) ×3
ELECTRODE REM PT RTRN 9FT ADLT (ELECTROSURGICAL) ×1 IMPLANT
GAUZE SPONGE 4X4 12PLY STRL (GAUZE/BANDAGES/DRESSINGS) ×3 IMPLANT
GLOVE BIOGEL PI IND STRL 9 (GLOVE) ×1 IMPLANT
GLOVE BIOGEL PI INDICATOR 9 (GLOVE) ×2
GLOVE SURG SYN 9.0  PF PI (GLOVE) ×2
GLOVE SURG SYN 9.0 PF PI (GLOVE) ×1 IMPLANT
GOWN SRG 2XL LVL 4 RGLN SLV (GOWNS) ×1 IMPLANT
GOWN STRL NON-REIN 2XL LVL4 (GOWNS) ×2
GOWN STRL REUS W/ TWL LRG LVL3 (GOWN DISPOSABLE) ×1 IMPLANT
GOWN STRL REUS W/TWL LRG LVL3 (GOWN DISPOSABLE) ×2
GUIDEPIN VERSANAIL DSP 3.2X444 (ORTHOPEDIC DISPOSABLE SUPPLIES) ×6 IMPLANT
HIP FR NAIL LAG SCREW 10.5X110 (Orthopedic Implant) ×3 IMPLANT
IV NS 500ML (IV SOLUTION) ×2
IV NS 500ML BAXH (IV SOLUTION) ×1 IMPLANT
KIT TURNOVER KIT A (KITS) ×3 IMPLANT
MAT BLUE FLOOR 46X72 FLO (MISCELLANEOUS) ×3 IMPLANT
NAIL HIP FRACT 130D 9X180 (Orthopedic Implant) ×3 IMPLANT
NEEDLE FILTER BLUNT 18X 1/2SAF (NEEDLE) ×2
NEEDLE FILTER BLUNT 18X1 1/2 (NEEDLE) ×1 IMPLANT
PACK HIP COMPR (MISCELLANEOUS) ×3 IMPLANT
SCALPEL PROTECTED #15 DISP (BLADE) ×6 IMPLANT
SCREW BONE CORTICAL 5.0X44 (Screw) ×3 IMPLANT
SCREW LAG HIP FR NAIL 10.5X110 (Orthopedic Implant) ×1 IMPLANT
STAPLER SKIN PROX 35W (STAPLE) ×3 IMPLANT
SUT VIC AB 1 CT1 36 (SUTURE) ×3 IMPLANT
SUT VIC AB 2-0 CT1 (SUTURE) ×3 IMPLANT
SYR 10ML LL (SYRINGE) ×3 IMPLANT
TAPE MICROFOAM 4IN (TAPE) ×3 IMPLANT

## 2018-04-08 NOTE — ED Notes (Signed)
Patient reports mechanical fall today. Patient c/o left hip pain. Patient reports he is not able to straighten or move left leg.

## 2018-04-08 NOTE — ED Triage Notes (Signed)
Pt arrived via ems with a fall. Pt states that he was going to the bathroom and started to fall backwards and could not stop. No LOC, but pt unable to move left leg. No NAD at present, respirations even and non labored.

## 2018-04-08 NOTE — ED Provider Notes (Signed)
[facility] Emergency Department Provider Note  ____________________________________________   First MD Initiated Contact with Patient 04/08/18 0040     (approximate)  I have reviewed the triage vital signs and the nursing notes.   HISTORY  Chief Complaint Fall    HPI Nathan Freeman is a 67 y.o. male who comes to the emergency department via EMS with severe left hip pain following a mechanical fall.  He got up to go to the bathroom tonight slipped and fell onto his left hip sustaining sudden onset severe sharp aching pain.  Pain is worse with movement improved with rest.  He has a past medical history of end-stage renal disease and was last dialyzed yesterday.    Past Medical History:  Diagnosis Date  . Acute pericarditis    Probable, Echo (7/10) showed mild LVH, normal LV size and systolic function, EF 55-60%, normal wall motion, mild MR, no AS, partially loculated small effusion along the RA free wall  . Anemia   . CHF (congestive heart failure) (HCC)   . DDD (degenerative disc disease), lumbar    Spine  . Depression   . Diabetes mellitus type II    Poor control due to medication noncompliance. Last hgbA1c 13.2  . Edema   . Hepatitis, unspecified   . Hyperlipidemia   . Hyperparathyroidism (HCC)   . Hypertension    Cough with a BP med in teh past, sounds like ACEI  . Kidney stones    dialysis t,t,s  . Malnutrition (HCC)   . Sleep apnea    cpap  . Stroke [facility](HCC)    02/2013 left side weakness    Patient Active Problem List   Diagnosis Date Noted  . Hip fx (HCC) 04/08/2018  . Fluid overload 01/06/2018  . Acute on chronic combined systolic and diastolic CHF (congestive heart failure) (HCC) 01/06/2018  . Diabetes (HCC) 01/06/2018  . ESRD on dialysis (HCC) 01/06/2018  . HLD (hyperlipidemia) 04/03/2009  . HTN (hypertension) 04/03/2009  . PERICARDITIS, ACUTE 04/03/2009  . SHORTNESS OF BREATH 04/03/2009    Past Surgical History:  Procedure  Laterality Date  . AV FISTULA PLACEMENT Left 12/11/2015   Procedure: ARTERIOVENOUS (AV) FISTULA CREATION ,BRACHIOCEPHALIC;  Surgeon: Annice NeedyJason S Dew, MD;  Location: [facility] ORS;  Service: Vascular;  Laterality: Left;  . COLONOSCOPY    . COLONOSCOPY WITH PROPOFOL N/A 05/17/2016   Procedure: COLONOSCOPY WITH PROPOFOL;  Surgeon: Christena DeemMartin U Skulskie, MD;  [facility];  Service: Endoscopy;  Laterality: N/A;  . INSERTION OF DIALYSIS CATHETER Right    chest  . LITHOTRIPSY    . ROTATOR CUFF REPAIR     Left shoulder, 2 times  . TONSILLECTOMY      Prior to Admission medications   Medication Sig Start Date End Date Taking? Authorizing Provider  acetaminophen (TYLENOL) 650 MG CR tablet Take 650 mg by mouth 2 (two) times daily.    Yes [provider]  albuterol (PROVENTIL HFA;VENTOLIN HFA) 108 (90 Base) MCG/ACT inhaler Inhale 2 puffs into the lungs every 4 (four) hours as needed for wheezing or shortness of breath.   Yes [provider]  amLODipine (NORVASC) 10 MG tablet Take 10 mg by mouth daily.    Yes [provider]  calcitRIOL (ROCALTROL) 0.25 MCG capsule Take 0.25 mcg by mouth 3 (three) times a week. On dialysis days (MON, WED, FRI)   Yes [provider]  carvedilol (COREG) 25 MG tablet Take 25 mg by mouth 2 (two) times daily  with a meal.   Yes [provider]  citalopram (CELEXA) 20 MG tablet Take 20 mg by mouth daily.    Yes [provider]  fluticasone (FLONASE) 50 MCG/ACT nasal spray Place 2 sprays into both nostrils daily as needed for allergies or rhinitis.   Yes [provider]  HYDROcodone-acetaminophen (NORCO) 5-325 MG tablet Take 1 tablet by mouth every 6 (six) hours as needed for moderate pain. Patient taking differently: Take 1 tablet by mouth 2 (two) times daily.  12/11/15  Yes Dew, Marlow Baars, MD  lidocaine (LIDODERM) 5 % Place 2 patches onto the skin daily. 1 patch to left lower back and 1 patch to right lower back. Remove  after 12 hours.   Yes [provider]  liver oil-zinc oxide (DESITIN) 40 % ointment Apply 1 application topically 2 (two) times daily. And as needed with toileting for skin protection   Yes [provider]  pantoprazole (PROTONIX) 40 MG tablet Take 40 mg by mouth daily.   Yes [provider]  rosuvastatin (CRESTOR) 10 MG tablet Take 10 mg by mouth daily.   Yes [provider]  senna-docusate (SENOKOT-S) 8.6-50 MG tablet Take 1 tablet by mouth at bedtime.   Yes [provider]  sevelamer carbonate (RENVELA) 800 MG tablet Take 800 mg by mouth 3 (three) times daily with meals.   Yes [provider]  tamsulosin (FLOMAX) 0.4 MG CAPS capsule Take 0.4 mg by mouth daily.   Yes [provider]  albuterol (PROVENTIL) (2.5 MG/3ML) 0.083% nebulizer solution Take 2.5 mg by nebulization every 6 (six) hours as needed for wheezing or shortness of breath.    [provider]  aspirin 81 MG EC tablet Take 81 mg by mouth daily.      [provider]  buPROPion (WELLBUTRIN XL) 150 MG 24 hr tablet Take 150 mg by mouth daily.    [provider]  chlorpheniramine-HYDROcodone (TUSSIONEX) 10-8 MG/5ML SUER Take 5 mLs by mouth every 12 (twelve) hours as needed for cough. Patient not taking: Reported on 04/08/2018 01/08/18   Milagros Loll, MD  docusate sodium (COLACE) 100 MG capsule Take 100 mg by mouth daily as needed for mild constipation.    [provider]  fish oil-omega-3 fatty acids 1000 MG capsule Take 2 capsules by mouth daily with supper.     [provider]  guaiFENesin (MUCINEX) 600 MG 12 hr tablet Take 600 mg by mouth 2 (two) times daily as needed for cough.    [provider]  lisinopril (PRINIVIL,ZESTRIL) 10 MG tablet Take 10 mg by mouth daily.    [provider]  ondansetron (ZOFRAN ODT) 4 MG disintegrating tablet Take 1 tablet (4 mg total) by mouth every 8 (eight) hours as needed for nausea  or vomiting. Patient not taking: Reported on 04/08/2018 03/01/18   Nita Sickle, MD  oxyCODONE-acetaminophen (PERCOCET) 5-325 MG tablet Take 1 tablet by mouth every 4 (four) hours as needed for severe pain. Patient not taking: Reported on 04/08/2018 03/01/18   Nita Sickle, MD  polycarbophil (FIBERCON) 625 MG tablet Take 625 mg by mouth 2 (two) times daily.    [provider]  predniSONE (STERAPRED UNI-PAK 21 TAB) 10 MG (21) TBPK tablet 6 tabs day 1 and taper 10 mg a day - 6 days Patient not taking: Reported on 04/08/2018 01/08/18   Milagros Loll, MD  ranitidine (ZANTAC) 150 MG tablet Take 150 mg by mouth 2 (two) times daily.    [provider]  thiamine 100 MG tablet Take by mouth. 02/09/18 02/09/19  [provider]  traMADol (ULTRAM) 50 MG tablet Take 50 mg by mouth every 6 (six) hours as needed for moderate pain (back pain).     [provider]    Allergies Patient has no known allergies.  Family History  Problem Relation Age of Onset  . Heart failure Mother   . Ulcers Mother        Venous ulcers  . Heart failure Father   . Heart attack Brother        3 heart attacks    Social History Social History   Tobacco Use  . Smoking status: Former Smoker    Types: Cigarettes    Last attempt to quit: 09/20/1972    Years since quitting: 45.5  . Smokeless tobacco: Never Used  . Tobacco comment: Quit at age 64  Substance Use Topics  . Alcohol use: No  . Drug use: No    Review of Systems Constitutional: No fever/chills Eyes: No visual changes. ENT: No sore throat. Cardiovascular: Denies chest pain. Respiratory: Denies shortness of breath. Gastrointestinal: No abdominal pain.  No nausea, no vomiting.   Genitourinary: Negative for dysuria. Musculoskeletal: Positive for hip pain Skin: Negative for rash. Neurological: Negative for headaches, focal weakness or numbness.   ____________________________________________   PHYSICAL  EXAM:  VITAL SIGNS: ED Triage Vitals  Enc Vitals Group     BP      Pulse      Resp      Temp      Temp src      SpO2      Weight      Height      Head Circumference      Peak Flow      Pain Score      Pain Loc      Pain Edu?      Excl. in GC?     Constitutional: Alert and oriented x4 crying in pain and appears miserable Eyes: PERRL EOMI. Head: Atraumatic. Nose: No congestion/rhinnorhea. Mouth/Throat: No trismus Neck: No stridor.   Cardiovascular: Normal rate, regular rhythm. Grossly normal heart sounds.  Good peripheral circulation. Respiratory: Normal respiratory effort.  No retractions. Lungs CTAB and moving good air Gastrointestinal: Soft nontender Musculoskeletal: Left hip held in flexion and slightly internally rotated.  Neurovascularly intact Neurologic:  Normal speech and language. No gross focal neurologic deficits are appreciated. Skin:  Skin is warm, dry and intact. No rash noted. Psychiatric: Mood and affect are normal. Speech and behavior are normal.    ____________________________________________   DIFFERENTIAL includes but not limited to  Hip fracture, hip dislocation, hip contusion ____________________________________________   LABS (all labs ordered are listed, but only abnormal results are displayed)  Labs Reviewed  SURGICAL PCR SCREEN - Abnormal; Notable for the following components:      Result Value   Staphylococcus aureus POSITIVE (*)    All other components within normal limits  COMPREHENSIVE METABOLIC PANEL - Abnormal; Notable for the following components:   Glucose, Bld 152 (*)    BUN 25 (*)    Creatinine, Ser 2.60 (*)    GFR calc non Af Amer 24 (*)    GFR calc Af Amer 28 (*)    All other components within normal limits  CBC WITH DIFFERENTIAL/PLATELET - Abnormal; Notable for the following components:   RBC 3.38 (*)    Hemoglobin 11.0 (*)    HCT 32.6 (*)  RDW 16.0 (*)    Lymphs Abs 0.4 (*)    All other components within normal  limits  BASIC METABOLIC PANEL - Abnormal; Notable for the following components:   Glucose, Bld 152 (*)    BUN 26 (*)    Creatinine, Ser 2.77 (*)    GFR calc non Af Amer 22 (*)    GFR calc Af Amer 26 (*)    All other components within normal limits  CBC - Abnormal; Notable for the following components:   WBC 13.8 (*)    RBC 3.46 (*)    Hemoglobin 11.3 (*)    HCT 34.3 (*)    RDW 16.5 (*)    All other components within normal limits  TYPE AND SCREEN    Lab work reviewed by me with evidence of CKD otherwise unremarkable __________________________________________  EKG  ED ECG REPORT I, Merrily Brittle, the attending physician, personally viewed and interpreted this ECG.  Date: 04/08/2018 EKG Time:  Rate: 57 Rhythm: Sinus bradycardia QRS Axis: Leftward axis Intervals: First-degree AV block ST/T Wave abnormalities: normal Narrative Interpretation: no evidence of acute ischemia  ____________________________________________  RADIOLOGY  X-ray of the hip reviewed by me with intertrochanteric fracture ____________________________________________   PROCEDURES  Procedure(s) performed: Yes  .Nerve Block Date/Time: 04/08/2018 1:55 AM Performed by: Merrily Brittle, MD Authorized by: Merrily Brittle, MD   Consent:    Consent obtained:  Verbal   Consent given by:  Patient   Risks discussed:  Nerve damage, pain, unsuccessful block and intravenous injection   Alternatives discussed:  Alternative treatment Indications:    Indications:  Pain relief Location:    Body area:  Lower extremity   Lower extremity nerve blocked: fascia iliaca block.   Laterality:  Left Pre-procedure details:    Skin preparation:  2% chlorhexidine Skin anesthesia (see MAR for exact dosages):    Skin anesthesia method:  None Procedure details (see MAR for exact dosages):    Block needle gauge:  20 G   Guidance: ultrasound     Anesthetic injected:  Bupivacaine 0.25% w/o epi   Steroid injected:   None   Additive injected:  None   Injection procedure:  Anatomic landmarks identified, anatomic landmarks palpated, incremental injection and negative aspiration for blood   Paresthesia:  None Post-procedure details:    Dressing:  None   Outcome:  Pain improved   Patient tolerance of procedure:  Tolerated well, no immediate complications    Critical Care performed: no  ____________________________________________   INITIAL IMPRESSION / ASSESSMENT AND PLAN / ED COURSE  Pertinent labs & imaging results that were available during my care of the patient were reviewed by me and considered in my medical decision making (see chart for details).   Patient arrives extremely uncomfortable appearing with his left hip held in flexion.  His extreme discomfort when ranging the hip whatsoever and have a high clinical suspicion for fracture.  100 mcg of IV fentanyl given with minimal relief and he was able to tolerate the x-ray which does confirm intertrochanteric fracture.  We will give him 6 mg of morphine IV now and discuss with on-call orthopedic surgery.  I discussed the case with Dr. Rosita Kea of orthopedic surgery who will kindly consult on the case.  I then discussed with Dr. Caryn Bee of the hospitalist service who has graciously agreed to admit the patient to her service.  I verbally consented the patient for a fascia iliac a block on the left and performed using direct ultrasound guidance  with some improvement in his pain.      ____________________________________________   FINAL CLINICAL IMPRESSION(S) / ED DIAGNOSES  Final diagnoses:  Closed displaced intertrochanteric fracture of left femur, initial encounter (HCC)      NEW MEDICATIONS STARTED DURING THIS VISIT:  Current Discharge Medication List       Note:  This document was prepared using Dragon voice recognition software and may include unintentional dictation errors.     Merrily Brittle, MD 04/08/18 [phone]

## 2018-04-08 NOTE — H&P (Signed)
[facility] - [place] at [facility]   PATIENT NAME: Nathan Freeman    MR#:  [id number]  DATE OF BIRTH:  24-Apr-1951  DATE OF ADMISSION:  04/08/2018  PRIMARY CARE PHYSICIAN: [facility]   REQUESTING/REFERRING PHYSICIAN:   CHIEF COMPLAINT:   Chief Complaint  Patient presents with  . Fall    HISTORY OF PRESENT ILLNESS: Nathan Freeman  is a 67 y.o. male with a known history of end-stage renal disease, CHF, osteoarthritis, dementia and other comorbidities. Patient is unable to provide reliable history due to his poor memory.  Most information was taken from reviewing the medical chart and from discussion with emergency room physician and the family. He was transferred from SNF, for left hip severe pain, status post mechanical fall.  Patient is currently in rehab for intracranial bleed, status post prior fall approximately 1 month ago.  He has been improving and was planned to be discharged in another 2 weeks from the rehab.  Per family, his memory has worsened since the head trauma. Per SNF reports, patient is ambulating daily with physical therapy.  No reports of fever, chills, chest pain, shortness of breath, no N/V/D. Blood test done emergency room are notable for creatinine level at 2.6 and hemoglobin level at 11. EKG shows normal sinus rhythm with a heart rate at 57 bpm, no acute ischemic changes.  Chest x-ray, reviewed by myself is negative for acute cardia pulmonary abnormalities. Left hip x-ray reveals acute comminuted intertrochanteric fracture of the left femur.  Patient is admitted for surgical repair of his left hip fracture.    PAST MEDICAL HISTORY:   Past Medical History:  Diagnosis Date  . Acute pericarditis    Probable, Echo (7/10) showed mild LVH, normal LV size and systolic function, EF 55-60%, normal wall motion, mild MR, no AS, partially loculated small effusion along the RA free wall  . Anemia   . CHF (congestive heart  failure) (HCC)   . DDD (degenerative disc disease), lumbar    Spine  . Depression   . Diabetes mellitus type II    Poor control due to medication noncompliance. Last hgbA1c 13.2  . Edema   . Hepatitis, unspecified   . Hyperlipidemia   . Hyperparathyroidism (HCC)   . Hypertension    Cough with a BP med in teh past, sounds like ACEI  . Kidney stones    dialysis t,t,s  . Malnutrition (HCC)   . Sleep apnea    cpap  . Stroke [facility])    02/2013 left side weakness    PAST SURGICAL HISTORY:  Past Surgical History:  Procedure Laterality Date  . AV FISTULA PLACEMENT Left 12/11/2015   Procedure: ARTERIOVENOUS (AV) FISTULA CREATION ,BRACHIOCEPHALIC;  Surgeon: Annice Needy, MD;  Location: [facility] ORS;  Service: Vascular;  Laterality: Left;  . COLONOSCOPY    . COLONOSCOPY WITH PROPOFOL N/A 05/17/2016   Procedure: COLONOSCOPY WITH PROPOFOL;  Surgeon: Christena Deem, MD;  Location: [facility] ENDOSCOPY;  Service: Endoscopy;  Laterality: N/A;  . INSERTION OF DIALYSIS CATHETER Right    chest  . LITHOTRIPSY    . ROTATOR CUFF REPAIR     Left shoulder, 2 times  . TONSILLECTOMY      SOCIAL HISTORY:  Social History   Tobacco Use  . Smoking status: Former Smoker    Types: Cigarettes    Last attempt to quit: 09/20/1972    Years since quitting: 45.5  . Smokeless tobacco: Never Used  . Tobacco comment:  Quit at age 67  Substance Use Topics  . Alcohol use: No    FAMILY HISTORY:  Family History  Problem Relation Age of Onset  . Heart failure Mother   . Ulcers Mother        Venous ulcers  . Heart failure Father   . Heart attack Brother        3 heart attacks    DRUG ALLERGIES: No Known Allergies  REVIEW OF SYSTEMS:   CONSTITUTIONAL: No fever, fatigue or weakness.  EYES: No blurred or double vision.  EARS, NOSE, AND THROAT: No tinnitus or ear pain.  RESPIRATORY: No cough, shortness of breath, wheezing or hemoptysis.  CARDIOVASCULAR: No chest pain, orthopnea, edema.  GASTROINTESTINAL: No  nausea, vomiting, diarrhea or abdominal pain.  GENITOURINARY: No dysuria, hematuria.  ENDOCRINE: No polyuria, nocturia,  HEMATOLOGY: No bleeding SKIN: No rash or lesion. MUSCULOSKELETAL: Positive for left hip severe pain.   NEUROLOGIC: No focal weakness.  PSYCHIATRY: Positive history of anxiety and depression.   MEDICATIONS AT HOME:  Prior to Admission medications   Medication Sig Start Date End Date Taking? Authorizing Provider  acetaminophen (TYLENOL) 650 MG CR tablet Take 650 mg by mouth 2 (two) times daily.    Yes [provider]  albuterol (PROVENTIL HFA;VENTOLIN HFA) 108 (90 Base) MCG/ACT inhaler Inhale 2 puffs into the lungs every 4 (four) hours as needed for wheezing or shortness of breath.   Yes [provider]  amLODipine (NORVASC) 10 MG tablet Take 10 mg by mouth daily.    Yes [provider]  calcitRIOL (ROCALTROL) 0.25 MCG capsule Take 0.25 mcg by mouth 3 (three) times a week. On dialysis days (MON, WED, FRI)   Yes [provider]  carvedilol (COREG) 25 MG tablet Take 25 mg by mouth 2 (two) times daily with a meal.   Yes [provider]  citalopram (CELEXA) 20 MG tablet Take 20 mg by mouth daily.    Yes [provider]  fluticasone (FLONASE) 50 MCG/ACT nasal spray Place 2 sprays into both nostrils daily as needed for allergies or rhinitis.   Yes [provider]  HYDROcodone-acetaminophen (NORCO) 5-325 MG tablet Take 1 tablet by mouth every 6 (six) hours as needed for moderate pain. Patient taking differently: Take 1 tablet by mouth 2 (two) times daily.  12/11/15  Yes Dew, Marlow BaarsJason S, MD  lidocaine (LIDODERM) 5 % Place 2 patches onto the skin daily. 1 patch to left lower back and 1 patch to right lower back. Remove after 12 hours.   Yes [provider]  liver oil-zinc oxide (DESITIN) 40 % ointment Apply 1 application topically 2 (two) times daily. And as needed with toileting for skin protection   Yes [provider]  pantoprazole (PROTONIX) 40 MG tablet Take 40 mg by mouth daily.   Yes [provider]  rosuvastatin (CRESTOR) 10 MG tablet Take 10 mg by mouth daily.   Yes [provider]  senna-docusate (SENOKOT-S) 8.6-50 MG tablet Take 1 tablet by mouth at bedtime.   Yes [provider]  sevelamer carbonate (RENVELA) 800 MG tablet Take 800 mg by mouth 3 (three) times daily with meals.   Yes [provider]  tamsulosin (FLOMAX) 0.4 MG CAPS capsule Take 0.4 mg by mouth daily.   Yes [provider]  albuterol (PROVENTIL) (2.5 MG/3ML) 0.083% nebulizer solution Take 2.5 mg by nebulization every 6 (six) hours as needed for wheezing or shortness of breath.    [provider]  aspirin 81 MG EC tablet Take 81 mg by mouth daily.      [provider]  buPROPion (WELLBUTRIN XL) 150 MG 24 hr tablet Take 150 mg by mouth daily.    [provider]  chlorpheniramine-HYDROcodone (TUSSIONEX) 10-8 MG/5ML SUER Take 5 mLs by mouth every 12 (twelve) hours as needed for cough. Patient not taking: Reported on 04/08/2018 01/08/18   Milagros Loll, MD  docusate sodium (COLACE) 100 MG capsule Take 100 mg by mouth daily as needed for mild constipation.    [provider]  fish oil-omega-3 fatty acids 1000 MG capsule Take 2 capsules by mouth daily with supper.     [provider]  guaiFENesin (MUCINEX) 600 MG 12 hr tablet Take 600 mg by mouth 2 (two) times daily as needed for cough.    [provider]  lisinopril (PRINIVIL,ZESTRIL) 10 MG tablet Take 10 mg by mouth daily.    [provider]  ondansetron (ZOFRAN ODT) 4 MG disintegrating tablet Take 1 tablet (4 mg total) by mouth every 8 (eight) hours as needed for nausea or vomiting. Patient not taking: Reported on 04/08/2018 03/01/18   Nita Sickle, MD  oxyCODONE-acetaminophen (PERCOCET) 5-325 MG tablet Take 1 tablet by mouth every 4 (four) hours as needed for severe  pain. Patient not taking: Reported on 04/08/2018 03/01/18   Nita Sickle, MD  polycarbophil (FIBERCON) 625 MG tablet Take 625 mg by mouth 2 (two) times daily.    [provider]  predniSONE (STERAPRED UNI-PAK 21 TAB) 10 MG (21) TBPK tablet 6 tabs day 1 and taper 10 mg a day - 6 days Patient not taking: Reported on 04/08/2018 01/08/18   Milagros Loll, MD  ranitidine (ZANTAC) 150 MG tablet Take 150 mg by mouth 2 (two) times daily.    [provider]  thiamine 100 MG tablet Take by mouth. 02/09/18 02/09/19  [provider]  traMADol (ULTRAM) 50 MG tablet Take 50 mg by mouth every 6 (six) hours as needed for moderate pain (back pain).     [provider]      PHYSICAL EXAMINATION:   VITAL SIGNS: Pulse (!) 58, temperature 98.1 F (36.7 C), temperature source Oral, height 6' (1.829 m), weight 116.1 kg (256 lb).  GENERAL:  67 y.o.-year-old patient lying in the bed with severe distress, secondary to left hip pain.  EYES: Pupils equal, round, reactive to light and accommodation. No scleral icterus. Extraocular muscles intact.  HEENT: Head atraumatic, normocephalic. Oropharynx and nasopharynx clear.  NECK:  Supple, no jugular venous distention. No thyroid enlargement, no tenderness.  LUNGS: Reduced breath sounds bilaterally, no wheezing, rales,rhonchi or crepitation. No use of accessory muscles of respiration.  CARDIOVASCULAR: S1, S2 normal. No 3/S4.  ABDOMEN: Soft, nontender, nondistended. Bowel sounds present. No organomegaly or mass.  EXTREMITIES: No pedal edema, cyanosis, or clubbing.  NEUROLOGIC: Cranial nerves II through XII are intact. Muscle strength 5/5 in all extremities. Sensation intact. Gait not checked.  Poor short-term memory. PSYCHIATRIC: The patient is alert and oriented x2.  SKIN: No obvious rash, lesion, or ulcer.   LABORATORY PANEL:   CBC Recent Labs  Lab 04/08/18 0050  WBC 5.6  HGB 11.0*  HCT 32.6*  PLT 198  MCV 96.3  MCH 32.6   MCHC 33.9  RDW 16.0*  LYMPHSABS 0.4*  MONOABS 0.6  EOSABS 0.1  BASOSABS 0.0   ------------------------------------------------------------------------------------------------------------------  Chemistries  Recent Labs  Lab 04/08/18 0050  NA 138  K 4.0  CL 99  CO2  28  GLUCOSE 152*  BUN 25*  CREATININE 2.60*  CALCIUM 9.0  AST 25  ALT 19  ALKPHOS 95  BILITOT 1.0   ------------------------------------------------------------------------------------------------------------------ estimated creatinine clearance is 37.3 mL/min (A) (by C-G formula based on SCr of 2.6 mg/dL (H)). ------------------------------------------------------------------------------------------------------------------ No results for input(s): TSH, T4TOTAL, T3FREE, THYROIDAB in the last 72 hours.  Invalid input(s): FREET3   Coagulation profile No results for input(s): INR, PROTIME in the last 168 hours. ------------------------------------------------------------------------------------------------------------------- No results for input(s): DDIMER in the last 72 hours. -------------------------------------------------------------------------------------------------------------------  Cardiac Enzymes No results for input(s): CKMB, TROPONINI, MYOGLOBIN in the last 168 hours.  Invalid input(s): CK ------------------------------------------------------------------------------------------------------------------ Invalid input(s): POCBNP  ---------------------------------------------------------------------------------------------------------------  Urinalysis    Component Value Date/Time   COLORURINE YELLOW 03/17/2018 1458   APPEARANCEUR HAZY (A) 03/17/2018 1458   APPEARANCEUR Cloudy 03/29/2013 2030   LABSPEC 1.015 03/17/2018 1458   LABSPEC 1.011 03/29/2013 2030   PHURINE 8.5 (H) 03/17/2018 1458   GLUCOSEU NEGATIVE 03/17/2018 1458   GLUCOSEU >=500 03/29/2013 2030   HGBUR TRACE (A) 03/17/2018  1458   BILIRUBINUR NEGATIVE 03/17/2018 1458   BILIRUBINUR Negative 03/29/2013 2030   KETONESUR NEGATIVE 03/17/2018 1458   PROTEINUR >300 (A) 03/17/2018 1458   NITRITE NEGATIVE 03/17/2018 1458   LEUKOCYTESUR SMALL (A) 03/17/2018 1458   LEUKOCYTESUR 3+ 03/29/2013 2030     RADIOLOGY: Dg Chest Port 1 View  Result Date: 04/08/2018 CLINICAL DATA:  Preoperative evaluation prior to ORIF. EXAM: PORTABLE CHEST 1 VIEW COMPARISON:  Prior radiograph from 01/09/2018. FINDINGS: Moderate cardiomegaly, stable. Mediastinal silhouette within normal limits. Lungs normally inflated. No focal infiltrates. Mild pulmonary interstitial congestion without pulmonary edema. No pleural effusion. No pneumothorax. No acute osseus abnormality. IMPRESSION: 1. Cardiomegaly with associated mild diffuse pulmonary interstitial congestion without overt pulmonary edema. 2. No other active cardiopulmonary disease. Electronically Signed   By: Rise Mu M.D.   On: 04/08/2018 01:22   Dg Hip Unilat With Pelvis 2-3 Views Left  Result Date: 04/08/2018 CLINICAL DATA:  Initial evaluation for acute trauma, fall. EXAM: DG HIP (WITH OR WITHOUT PELVIS) 2-3V LEFT COMPARISON:  None. FINDINGS: Acute comminuted intertrochanteric fracture of the left femur with minimal displacement. Left femoral head remains normally position within the acetabulum. Femoral head height maintained. Bony pelvis intact. SI joints appear ankylosed. 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 Degenerative changes noted within the lower lumbar spine. IMPRESSION: Acute comminuted intertrochanteric fracture of the left femur. Electronically Signed   By: Rise Mu M.D.   On: 04/08/2018 01:20    EKG: Orders placed or performed during the hospital encounter of 04/08/18  . ED EKG  . ED EKG  . EKG 12-Lead  . EKG 12-Lead    IMPRESSION AND PLAN:  1.  Acute comminuted intertrochanteric fracture of the left femur.  Patient is scheduled  for surgical repair in a.m. We will continue pain management.  Avoid anticoagulants.  Keep patient n.p.o. Based on the review of his physical capacity, medical problems and EKG report, patient is medically clear for the above orthopedic procedure.  He is a moderate risk for postop cardiopulmonary complications. 2.  End-stage renal disease, on hemodialysis.  Continue treatment per nephrology. 3.  CHF, currently clinically compensated, continue medical treatment. 4.  Status post recent ICH.  Continue to monitor clinically closely. 5.  Hypertension, stable, continue home medications.  All the records are reviewed and case discussed with ED provider. Management plans discussed with the patient, family and they are in agreement.  CODE STATUS: Full Code Status History  Date Active Date Inactive Code Status Order ID Comments User Context   01/07/2018 0006 01/08/2018 2042 Full Code [id number]  Oralia Manis, MD ED       TOTAL TIME TAKING CARE OF THIS PATIENT: 45 minutes.    Cammy Copa M.D on 04/08/2018 at 1:48 AM  Between 7am to 6pm - Pager - [phone]  After 6pm go to [email or web address] - password [email or web address] [facility]  Office  [phone]  CC: Primary care physician; [employer] [employer]

## 2018-04-08 NOTE — Progress Notes (Signed)
Pt stating he should be a FULL code. MD Mody notified. Orders received to change patients code status to FULL CODE.  Order placed.

## 2018-04-08 NOTE — Transfer of Care (Signed)
Immediate Anesthesia Transfer of Care Note  Patient: Nathan Freeman  Procedure(s) Performed: INTRAMEDULLARY (IM) NAIL FEMORAL (Left )  Patient Location: PACU  Anesthesia Type:General  Level of Consciousness: awake and alert   Airway & Oxygen Therapy: Patient Spontanous Breathing and Patient connected to face mask oxygen  Post-op Assessment: Report given to RN  Post vital signs: Reviewed and stable  Last Vitals:  Vitals Value Taken Time  BP 130/63 04/08/2018  9:24 AM  Temp    Pulse 65 04/08/2018  9:26 AM  Resp 21 04/08/2018  9:26 AM  SpO2 81 % 04/08/2018  9:26 AM  Vitals shown include unvalidated device data.  Last Pain:  Vitals:   04/08/18 0752  TempSrc: Oral  PainSc:       Patients Stated Pain Goal: 2 (04/08/18 0539)  Complications: No apparent anesthesia complications

## 2018-04-08 NOTE — Progress Notes (Signed)
[facility]  ROUNDING NOTE   Subjective:   [name] admitted to [facility] on 04/08/2018 for Fall Patient had left hip fracture - status post left hip nail placement by Dr. Rosita KeaMenz.   Last hemodialysis treatment on Friday.   Objective:  Vital signs in last 24 hours:  Temp:  [97.6 F (36.4 C)-99.4 F (37.4 C)] 97.6 F (36.4 C) (07/20 1157) Pulse Rate:  [56-70] 67 (07/20 1157) Resp:  [8-22] 16 (07/20 1157) BP: (130-149)/(63-75) 144/69 (07/20 1157) SpO2:  [78 %-97 %] 97 % (07/20 1157) Weight:  [73.9 kg (163 lb)-116.1 kg (256 lb)] 73.9 kg (163 lb) (07/20 0431)  Weight change:  Filed Weights   04/08/18 0045 04/08/18 0431  Weight: 116.1 kg (256 lb) 73.9 kg (163 lb)    Intake/Output: I/O last 3 completed shifts: In: 53.8 [I.V.:53.8] Out: -    Intake/Output this shift:  Total I/O In: 450 [I.V.:450] Out: 50 [Blood:50]  Physical Exam: General: NAD, laying in bed  Head: Normocephalic, atraumatic. Moist oral mucosal membranes  Eyes: Anicteric, PERRL  Neck: Supple, trachea midline  Lungs:  Clear to auscultation  Heart: Regular rate and rhythm  Abdomen:  Soft, nontender,   Extremities: no peripheral edema.  Neurologic: Nonfocal, moving all four extremities  Skin: No lesions  Access: Left AVF    Basic Metabolic Panel: Recent Labs  Lab 04/08/18 0050 04/08/18 0517  NA 138 136  K 4.0 4.9  CL 99 99  CO2 28 26  GLUCOSE 152* 152*  BUN 25* 26*  CREATININE 2.60* 2.77*  CALCIUM 9.0 9.0    Liver Function Tests: Recent Labs  Lab 04/08/18 0050  AST 25  ALT 19  ALKPHOS 95  BILITOT 1.0  PROT 7.1  ALBUMIN 3.5   No results for input(s): LIPASE, AMYLASE in the last 168 hours. No results for input(s): AMMONIA in the last 168 hours.  CBC: Recent Labs  Lab 04/08/18 0050 04/08/18 0517  WBC 5.6 13.8*  NEUTROABS 4.5  --   HGB 11.0* 11.3*  HCT 32.6* 34.3*  MCV 96.3 99.0  PLT 198 222    Cardiac Enzymes: No results for input(s): CKTOTAL, CKMB,  CKMBINDEX, TROPONINI in the last 168 hours.  BNP: Invalid input(s): POCBNP  CBG: Recent Labs  Lab 04/08/18 0946  GLUCAP 166*    Microbiology: Results for orders placed or performed during the hospital encounter of 04/08/18  Surgical pcr screen     Status: Abnormal   Collection Time: 04/08/18  3:16 AM  Result Value Ref Range Status   MRSA, PCR NEGATIVE NEGATIVE Final   Staphylococcus aureus POSITIVE (A) NEGATIVE Final    Comment: (NOTE) The Xpert SA Assay (FDA approved for NASAL specimens in patients 66 years of age and older), is one component of a comprehensive surveillance program. It is not intended to diagnose infection nor to guide or monitor treatment. Performed at [facility], [address] [place], [place] [address]     Coagulation Studies: No results for input(s): LABPROT, INR in the last 72 hours.  Urinalysis: No results for input(s): COLORURINE, LABSPEC, PHURINE, GLUCOSEU, HGBUR, BILIRUBINUR, KETONESUR, PROTEINUR, UROBILINOGEN, NITRITE, LEUKOCYTESUR in the last 72 hours.  Invalid input(s): APPERANCEUR    Imaging: Dg Chest Port 1 View  Result Date: 04/08/2018 CLINICAL DATA:  Preoperative evaluation prior to ORIF. EXAM: PORTABLE CHEST 1 VIEW COMPARISON:  Prior radiograph from 01/09/2018. FINDINGS: Moderate cardiomegaly, stable. Mediastinal silhouette within normal limits. Lungs normally inflated. No focal infiltrates. Mild pulmonary interstitial congestion  without pulmonary edema. No pleural effusion. No pneumothorax. No acute osseus abnormality. IMPRESSION: 1. Cardiomegaly with associated mild diffuse pulmonary interstitial congestion without overt pulmonary edema. 2. No other active cardiopulmonary disease. Electronically Signed   By: Rise Mu M.D.   On: 04/08/2018 01:22   Dg Hip Operative Unilat W Or W/o Pelvis Left  Result Date: 04/08/2018 CLINICAL DATA:  Left intertrochanteric femur fracture EXAM: OPERATIVE LEFT HIP (WITH PELVIS IF  PERFORMED) 3 VIEWS TECHNIQUE: Fluoroscopic spot image(s) were submitted for interpretation post-operatively. COMPARISON:  Earlier films of the same day FINDINGS: 3 fluoroscopic spot images document IM rod and interlocking sliding screw fixation across the left femoral intertrochanteric fracture. A single distal interlocking screw is noted. Fracture fragments in near anatomic alignment. IMPRESSION: Internal fixation of left intertrochanteric femur fracture. Electronically Signed   By: Corlis Leak M.D.   On: 04/08/2018 09:50   Dg Hip Unilat With Pelvis 2-3 Views Left  Result Date: 04/08/2018 CLINICAL DATA:  Initial evaluation for acute trauma, fall. EXAM: DG HIP (WITH OR WITHOUT PELVIS) 2-3V LEFT COMPARISON:  None. FINDINGS: Acute comminuted intertrochanteric fracture of the left femur with minimal displacement. Left femoral head remains normally position within the acetabulum. Femoral head height maintained. Bony pelvis intact. SI joints appear ankylosed. 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 Degenerative changes noted within the lower lumbar spine. IMPRESSION: Acute comminuted intertrochanteric fracture of the left femur. Electronically Signed   By: Rise Mu M.D.   On: 04/08/2018 01:20     Medications:   .  ceFAZolin (ANCEF) IV    . methocarbamol (ROBAXIN)  IV     . acetaminophen  500 mg Oral Q6H  . amLODipine  10 mg Oral Daily  . [START ON 04/09/2018] aspirin EC  325 mg Oral Q breakfast  . bupivacaine      . buPROPion  150 mg Oral Daily  . [START ON 04/10/2018] calcitRIOL  0.25 mcg Oral Once per day on Mon Wed Fri  . carvedilol  25 mg Oral BID WC  . citalopram  20 mg Oral Daily  . docusate sodium  100 mg Oral BID  . fentaNYL      . lisinopril  10 mg Oral Daily  . omega-3 acid ethyl esters  2 capsule Oral Q supper  . pantoprazole  40 mg Oral Daily  . rosuvastatin  10 mg Oral Daily  . senna-docusate  1 tablet Oral QHS  . sevelamer carbonate  800 mg Oral TID  WC  . tamsulosin  0.4 mg Oral Daily  . thiamine  100 mg Oral Daily   [DISCONTINUED] acetaminophen **OR** acetaminophen, [START ON 04/09/2018] acetaminophen, albuterol, alum & mag hydroxide-simeth, bisacodyl, bisacodyl, fluticasone, HYDROcodone-acetaminophen, HYDROcodone-acetaminophen, HYDROmorphone (DILAUDID) injection, magnesium citrate, magnesium hydroxide, menthol-cetylpyridinium **OR** phenol, methocarbamol **OR** methocarbamol (ROBAXIN)  IV, metoCLOPramide **OR** metoCLOPramide (REGLAN) injection, morphine injection, ondansetron **OR** ondansetron (ZOFRAN) IV, traMADol, traZODone  Assessment/ Plan:  Nathan Freeman is a 67 y.o. white male end stage renal disease on hemodialysis MWF, hypertension, CVA with left sided weakness, obstructive sleep apnea, hyperlipidemia, diabetes mellitus type II, congestive heart failure, pericarditis admitted to [facility] on 7/20 for left hip fracture. Status post surgery on 7/20 by Dr. Rosita Kea.   MWF [facility] left AVF  1. End stage renal disease: last hemodialysis treatment was yesterday, Friday. Next treatment scheduled for Monday.   2. Hypertension: blood pressure at goal. Home regimen of carvedilol, lisinopril and amlodipine.   3. Anemia of chronic kidney disease: hemoglobin 11.3 -  Mircera as outpatient.   4. Secondary Hyperparathyroidism: calcium at goal - sevelamer with meals.  - Calcitriol on dialysis days.     LOS: 0 Delores Thelen 7/20/201912:00 PM

## 2018-04-08 NOTE — Anesthesia Postprocedure Evaluation (Signed)
Anesthesia Post Note  Patient: Nathan Freeman  Procedure(s) Performed: INTRAMEDULLARY (IM) NAIL FEMORAL (Left )  Patient location during evaluation: PACU Anesthesia Type: General Level of consciousness: awake and alert Pain management: pain level controlled Vital Signs Assessment: post-procedure vital signs reviewed and stable Respiratory status: spontaneous breathing, nonlabored ventilation, respiratory function stable and patient connected to nasal cannula oxygen Cardiovascular status: blood pressure returned to baseline and stable Postop Assessment: no apparent nausea or vomiting Anesthetic complications: no     Last Vitals:  Vitals:   04/08/18 1136 04/08/18 1139  BP: (!) 141/69   Pulse: 70   Resp: 16   Temp:    SpO2: (!) 78% (P) 90%    Last Pain:  Vitals:   04/08/18 0924  TempSrc:   PainSc: Asleep                 Lenard SimmerAndrew Odell Fasching

## 2018-04-08 NOTE — Progress Notes (Addendum)
Family Meeting Note  Advance Directive:yes  Today a meeting took place with the daughters.and patient    The following clinical team members were present during this meeting:MD  The following were discussed:Patient's diagnosis: hip fx, Patient's progosis: Unable to determine and Goals for treatment:LIMITED NO MV  Additional follow-up to be provided: none at this time    Time spent during discussion:16 minutes  Amey Hossain, MD

## 2018-04-08 NOTE — Anesthesia Procedure Notes (Signed)
Procedure Name: Intubation Performed by: Lesle Reek, CRNA Pre-anesthesia Checklist: Patient identified, Timeout performed, Patient being monitored, Suction available and Emergency Drugs available Oxygen Delivery Method: Circle system utilized Preoxygenation: Pre-oxygenation with 100% oxygen Induction Type: IV induction Laryngoscope Size: Mac and 4 Grade View: Grade II Tube type: Oral Tube size: 7.5 mm Number of attempts: 1 Placement Confirmation: positive ETCO2,  CO2 detector,  breath sounds checked- equal and bilateral and ETT inserted through vocal cords under direct vision Secured at: 21 cm Tube secured with: Tape

## 2018-04-08 NOTE — Progress Notes (Signed)
Pharmacy Antibiotic Note  Nathan Freeman is a 67 y.o. male admitted on 04/08/2018 with pneumonia.  Pharmacy has been consulted for Zosyn dosing. He has end stage renal disease on hemodialysis MWF, hypertension, CVA with left sided weakness, obstructive sleep apnea, hyperlipidemia, diabetes mellitus type II, congestive heart failure, pericarditis admitted to [facility] on 7/20 for left hip fracture. Status post surgery on 7/20 by Dr. Rosita KeaMenz  Plan: Zosyn 3.375g IV q12h (4 hour infusion).  Height: 6' (182.9 cm) Weight: 163 lb (73.9 kg) IBW/kg (Calculated) : 77.6  Temp (24hrs), Avg:98.3 F (36.8 C), Min:97.6 F (36.4 C), Max:99.4 F (37.4 C)  Recent Labs  Lab 04/08/18 0050 04/08/18 0517  WBC 5.6 13.8*  CREATININE 2.60* 2.77*    Estimated Creatinine Clearance: 27.8 mL/min (A) (by C-G formula based on SCr of 2.77 mg/dL (H)).    No Known Allergies  Antimicrobials this admission: Cefazolin 7/20  Zosyn 7/20 >>   Microbiology results: none  Thank you for allowing pharmacy to be a part of this patient's care.  Lowella Bandyodney D Vannary Greening, PharmD 04/08/2018 3:03 PM

## 2018-04-08 NOTE — Consult Note (Signed)
Patient is a 67 year old who suffered a fall last night at home.  He states that he has trouble walking and uses a walker.  He just fell and does not report any loss of consciousness.  He is predominantly a household ambulator.  On examination he holds the left leg in a flexed position.  There is no ecchymosis skin is intact.  He is able to flex extend his toes and has intact sensation with trace dorsalis pedis pulse.  X-rays reveal appears to be a intertrochanteric fracture with his flexion is a little difficult to determine how low it goes.  Impression is left intertrochanteric hip fracture  Plan is ORIF with IM rod I discussed the surgery with him and necessity to get immobilized he agrees with this we will perform that this morning.

## 2018-04-08 NOTE — ED Notes (Signed)
ED Provider at bedside. 

## 2018-04-08 NOTE — Progress Notes (Signed)
Patient briefly seen this morning and examined.  Family at bedside.  Patient for hip surgery today. Agree with admitting MD plan.

## 2018-04-08 NOTE — ED Notes (Signed)
Patient's oxygen saturation decreased to 84% on RA. Admitting MD at bedside. Patient placed on 4L [place]. Patient's oxygen saturation increased to 93% on 4L. RN will continue to monitor.

## 2018-04-08 NOTE — Anesthesia Preprocedure Evaluation (Signed)
Anesthesia Evaluation  Patient identified by MRN, date of birth, ID band Patient awake    Reviewed: Allergy & Precautions, H&P , NPO status , Patient's Chart, lab work & pertinent test results, reviewed documented beta blocker date and time   History of Anesthesia Complications Negative for: history of anesthetic complications  Airway Mallampati: I  TM Distance: >3 FB Neck ROM: full    Dental no notable dental hx. (+) Chipped, Poor Dentition, Caps, Dental Advidsory Given Permanent bridge on the top front:   Pulmonary shortness of breath and with exertion, sleep apnea and Continuous Positive Airway Pressure Ventilation , neg COPD, neg recent URI, former smoker,           Cardiovascular Exercise Tolerance: Good hypertension, (-) angina+CHF  (-) CAD, (-) Past MI, (-) Cardiac Stents and (-) CABG negative cardio ROS  (-) dysrhythmias (-) Valvular Problems/Murmurs     Neuro/Psych neg Seizures PSYCHIATRIC DISORDERS (Depression) Depression CVA (left sided weakness), Residual Symptoms    GI/Hepatic GERD  Medicated,(+) Hepatitis -  Endo/Other  diabetes  Renal/GU ESRF and DialysisRenal disease  negative genitourinary   Musculoskeletal   Abdominal   Peds  Hematology  (+) Blood dyscrasia, anemia ,   Anesthesia Other Findings Past Medical History:   DDD (degenerative disc disease), lumbar                        Comment:Spine   Hepatitis, unspecified                                       Hyperlipidemia                                               Diabetes mellitus type II                                      Comment:Poor control due to medication noncompliance.               Last hgbA1c 13.2   Hypertension                                                   Comment:Cough with a BP med in teh past, sounds like               ACEI   Acute pericarditis                                             Comment:Probable, Echo (7/10) showed  mild LVH, normal               LV size and systolic function, EF 55-60%,               normal wall motion, mild MR, no AS, partially               loculated small effusion along the RA free wall   Stroke (  HCC)                                                   Comment:02/2013 left side weakness   Sleep apnea                                                    Comment:cpap   CHF (congestive heart failure) (HCC)                         Depression                                                   Kidney stones                                                  Comment:dialysis t,t,s   Anemia                                                       Hyperparathyroidism (HCC)                                    Edema                                                        Malnutrition (HCC)                                           Reproductive/Obstetrics negative OB ROS                             Anesthesia Physical  Anesthesia Plan  ASA: IV  Anesthesia Plan: General   Post-op Pain Management:    Induction: Intravenous  PONV Risk Score and Plan: 2 and Ondansetron and Dexamethasone  Airway Management Planned: Oral ETT  Additional Equipment:   Intra-op Plan:   Post-operative Plan: Extubation in OR  Informed Consent: I have reviewed the patients History and Physical, chart, labs and discussed the procedure including the risks, benefits and alternatives for the proposed anesthesia with the patient or authorized representative who has indicated his/her understanding and acceptance.   Dental Advisory Given  Plan Discussed with: Anesthesiologist, CRNA and Surgeon  Anesthesia Plan Comments:         Anesthesia Quick Evaluation

## 2018-04-08 NOTE — Op Note (Signed)
04/08/2018  9:16 AM  PATIENT:  Catarina HartshornGerry W Calligan  67 y.o. male  PRE-OPERATIVE DIAGNOSIS:  left hip fracture, intertrochanteric  POST-OPERATIVE DIAGNOSIS:  same  PROCEDURE:  Procedure(s): INTRAMEDULLARY (IM) NAIL FEMORAL (Left)  SURGEON: Leitha SchullerMichael J Nydia Ytuarte, MD  ASSISTANTS: None  ANESTHESIA:   general  EBL:  No intake/output data recorded.  BLOOD ADMINISTERED:none  DRAINS: none   LOCAL MEDICATIONS USED:  NONE  SPECIMEN:  No Specimen  DISPOSITION OF SPECIMEN:  N/A  COUNTS:  YES  TOURNIQUET:  * No tourniquets in log *  IMPLANTS: Biomet affixes 9 x 180 mm, 130 degrees rod with 110 mm leg screw and 44 mm interlocking screw  DICTATION: .Dragon Dictation patient was brought to the operating room and after adequate anesthesia was obtained the patient was transferred to the fracture table.  The left foot was placed in the traction boot with traction applied in the right leg in the well-leg holder with both hips being noted to be quite stiff.  C arm was brought in good visualization of the fracture was identified with a fairly high intertrochanteric fracture with a predominantly vertical orientation.  The hip was then prepped and draped in the usual sterile fashion and appropriate patient identification and timeout procedure were completed.  An incision was made proximally at the level of the greater trochanter and a guidewire inserted into a center center position of the tip of the trochanter proximal reaming carried out and the rod inserted to the appropriate depth.  A guidewire was inserted to a center center position measured and drilled followed by tapping and then placement of the leg screw.  The the distal interlocking screw was then placed through the static drill hole drilling measuring and placing the 44 mm screw.  Permanent serum views AP lateral proximally and AP distally were obtained the wounds were then irrigated after removal of the insertion handle.  Wounds were closed with a  heavy #1 Vicryl proximally 2-0 Vicryl subcutaneously and skin staples followed by honeycomb dressing.  PLAN OF CARE: Continue as inpatient  PATIENT DISPOSITION:  PACU - hemodynamically stable.

## 2018-04-08 NOTE — Clinical Social Work Note (Signed)
Clinical Social Work Assessment  Patient Details  Name: Nathan Freeman MRN: [id number] Date of Birth: August 30, 1951  Date of referral:  04/08/18               Reason for consult:  Facility Placement                Permission sought to share information with:  Chartered certified accountant granted to share information::  Yes, Verbal Permission Granted  Name::        Agency::  [facility] contracted SNFs ([facility])  Relationship::     Contact Information:     Housing/Transportation Living arrangements for the past 2 months:  [place] of Information:  Patient, Medical Team, Other (Comment Required)(Grandson) Patient Interpreter Needed:  None Criminal Activity/Legal Involvement Pertinent to Current Situation/Hospitalization:  No - Comment as needed Significant Relationships:  Adult Children, Warehouse manager, Other Family Members Lives with:  Self Do you feel safe going back to the place where you live?  Yes Need for family participation in patient care:  No (Coment)  Care giving concerns: Patient has a hip fracture   Social Worker assessment / plan: The CSW met with the patient and his grandson at bedside to discuss discharge planning. The patient was alert and comfortable. The CSW explained the referral process and how [facility] covers SNF. The patient verbalized permission to send the referral to the 2 [facility] contracted SNFs ([facility]); however, he shared that he may choose to go home instead.   PT evaluation is pending. The patient had surgery today. CSW will contact [facility] once PT evaluation has been completed.   Employment status:  Retired Forensic scientist:  Managed Care PT Recommendations:  Not assessed at this time Information / Referral to community resources:  [place], [facility]  Patient/Family's Response to care:  The patient and his grandson thanked the CSW.  Patient/Family's Understanding of and  Emotional Response to Diagnosis, Current Treatment, and Prognosis:  The patient understands the referral process and is willing to work with PT.  Emotional Assessment Appearance:  Appears stated age Attitude/Demeanor/Rapport:  Self-Confident, Charismatic, Gracious, Engaged Affect (typically observed):  Pleasant, Stable Orientation:  Oriented to Self, Oriented to Place, Oriented to  Time, Oriented to Situation Alcohol / Substance use:  Never Used Psych involvement (Current and /or in the community):  No (Comment)  Discharge Needs  Concerns to be addressed:  Care Coordination, Discharge Planning Concerns Readmission within the last 30 days:  No Current discharge risk:  Physical Impairment Barriers to Discharge:  Continued Medical Work up   [employer], LCSW 04/08/2018, 2:42 PM

## 2018-04-08 NOTE — NC FL2 (Signed)
[place] MEDICAID FL2 LEVEL OF CARE SCREENING TOOL     IDENTIFICATION  Patient Name: Nathan Freeman Birthdate: [birth year] Sex: male Admission Date (Current Location): 04/08/2018  [place] Number:  Chiropodist and Address:  [facility], [address], [place], [place] [address]      Provider Number: [id number]  Attending Physician Name and Address:  Adrian Saran, MD  Relative Name and Phone Number:  April Baldwin (Daughter) [phone] or Luan Moore (Daughter) [phone]    Current Level of Care: Hospital Recommended Level of Care: Skilled Nursing Facility Prior Approval Number:    Date Approved/Denied:   PASRR Number:    Discharge Plan: SNF    Current Diagnoses: Patient Active Problem List   Diagnosis Date Noted  . Hip fx (HCC) 04/08/2018  . Fluid overload 01/06/2018  . Acute on chronic combined systolic and diastolic CHF (congestive heart failure) (HCC) 01/06/2018  . Diabetes (HCC) 01/06/2018  . ESRD on dialysis (HCC) 01/06/2018  . HLD (hyperlipidemia) 04/03/2009  . HTN (hypertension) 04/03/2009  . PERICARDITIS, ACUTE 04/03/2009  . SHORTNESS OF BREATH 04/03/2009    Orientation RESPIRATION BLADDER Height & Weight     Self, Time, Situation, Place  O2(10L o2) Continent Weight: 163 lb (73.9 kg) Height:  6' (182.9 cm)  BEHAVIORAL SYMPTOMS/MOOD NEUROLOGICAL BOWEL NUTRITION STATUS      Continent Diet(Carb modified)  AMBULATORY STATUS COMMUNICATION OF NEEDS Skin   Extensive Assist   Surgical wounds                       Personal Care Assistance Level of Assistance  Bathing, Feeding, Dressing Bathing Assistance: Limited assistance Feeding assistance: Independent Dressing Assistance: Limited assistance     Functional Limitations Info  Sight, Hearing, Speech Sight Info: Adequate Hearing Info: Adequate Speech Info: Adequate    SPECIAL CARE FACTORS FREQUENCY  PT (By licensed PT)     PT Frequency: Up  to 5X per day              Contractures Contractures Info: Not present    Additional Factors Info  Code Status, Allergies Code Status Info: Full Allergies Info: No Known Allergies           Current Medications (04/08/2018):  This is the current hospital active medication list Current Facility-Administered Medications  Medication Dose Route Frequency Provider Last Rate Last Dose  . acetaminophen (TYLENOL) suppository 650 mg  650 mg Rectal Q6H PRN Kennedy Bucker, MD      . Melene Muller ON 04/09/2018] acetaminophen (TYLENOL) tablet 325-650 mg  325-650 mg Oral Q6H PRN Kennedy Bucker, MD      . acetaminophen (TYLENOL) tablet 500 mg  500 mg Oral Q6H Kennedy Bucker, MD      . albuterol (PROVENTIL) (2.5 MG/3ML) 0.083% nebulizer solution 3 mL  3 mL Inhalation Q4H PRN Kennedy Bucker, MD      . alum & mag hydroxide-simeth (MAALOX/MYLANTA) 200-200-20 MG/5ML suspension 30 mL  30 mL Oral Q4H PRN Kennedy Bucker, MD      . amLODipine (NORVASC) tablet 10 mg  10 mg Oral Daily Kennedy Bucker, MD   10 mg at 04/08/18 1443  . [START ON 04/09/2018] aspirin EC tablet 325 mg  325 mg Oral Q breakfast Kennedy Bucker, MD      . bisacodyl (DULCOLAX) EC tablet 5 mg  5 mg Oral Daily PRN Kennedy Bucker, MD      . bisacodyl (DULCOLAX) suppository 10 mg  10 mg  Rectal Daily PRN Kennedy BuckerMenz, Michael, MD      . buPROPion (WELLBUTRIN XL) 24 hr tablet 150 mg  150 mg Oral Daily Kennedy BuckerMenz, Michael, MD      . Melene Muller[START ON 04/10/2018] calcitRIOL (ROCALTROL) capsule 0.25 mcg  0.25 mcg Oral Once per day on Mon Wed Fri Menz, Michael, MD      . carvedilol (COREG) tablet 25 mg  25 mg Oral BID WC Kennedy BuckerMenz, Michael, MD   25 mg at 04/08/18 0750  . ceFAZolin (ANCEF) IVPB 1 g/50 mL premix  1 g Intravenous Q6H Kennedy BuckerMenz, Michael, MD      . citalopram (CELEXA) tablet 20 mg  20 mg Oral Daily Kennedy BuckerMenz, Michael, MD      . docusate sodium (COLACE) capsule 100 mg  100 mg Oral BID Kennedy BuckerMenz, Michael, MD      . fentaNYL (SUBLIMAZE) 100 MCG/2ML injection           . fluticasone (FLONASE) 50  MCG/ACT nasal spray 2 spray  2 spray Each Nare Daily PRN Kennedy BuckerMenz, Michael, MD      . HYDROcodone-acetaminophen (NORCO/VICODIN) 5-325 MG per tablet 1-2 tablet  1-2 tablet Oral Q4H PRN Kennedy BuckerMenz, Michael, MD   1 tablet at 04/08/18 1206  . HYDROcodone-acetaminophen (NORCO/VICODIN) 5-325 MG per tablet 1-2 tablet  1-2 tablet Oral Q4H PRN Kennedy BuckerMenz, Michael, MD      . HYDROmorphone (DILAUDID) injection 0.5 mg  0.5 mg Intravenous Q4H PRN Kennedy BuckerMenz, Michael, MD   0.5 mg at 04/08/18 0539  . lisinopril (PRINIVIL,ZESTRIL) tablet 10 mg  10 mg Oral Daily Kennedy BuckerMenz, Michael, MD      . magnesium citrate solution 1 Bottle  1 Bottle Oral Once PRN Kennedy BuckerMenz, Michael, MD      . magnesium hydroxide (MILK OF MAGNESIA) suspension 30 mL  30 mL Oral Daily PRN Kennedy BuckerMenz, Michael, MD      . menthol-cetylpyridinium (CEPACOL) lozenge 3 mg  1 lozenge Oral PRN Kennedy BuckerMenz, Michael, MD       Or  . phenol (CHLORASEPTIC) mouth spray 1 spray  1 spray Mouth/Throat PRN Kennedy BuckerMenz, Michael, MD      . methocarbamol (ROBAXIN) tablet 500 mg  500 mg Oral Q6H PRN Kennedy BuckerMenz, Michael, MD   500 mg at 04/08/18 1443   Or  . methocarbamol (ROBAXIN) 500 mg in dextrose 5 % 50 mL IVPB  500 mg Intravenous Q6H PRN Kennedy BuckerMenz, Michael, MD      . metoCLOPramide (REGLAN) tablet 5-10 mg  5-10 mg Oral Q8H PRN Kennedy BuckerMenz, Michael, MD       Or  . metoCLOPramide (REGLAN) injection 5-10 mg  5-10 mg Intravenous Q8H PRN Kennedy BuckerMenz, Michael, MD      . morphine 2 MG/ML injection 0.5-1 mg  0.5-1 mg Intravenous Q2H PRN Kennedy BuckerMenz, Michael, MD      . omega-3 acid ethyl esters (LOVAZA) capsule 2 g  2 capsule Oral Q supper Kennedy BuckerMenz, Michael, MD      . ondansetron [facility](ZOFRAN) tablet 4 mg  4 mg Oral Q6H PRN Kennedy BuckerMenz, Michael, MD       Or  . ondansetron [facility](ZOFRAN) injection 4 mg  4 mg Intravenous Q6H PRN Kennedy BuckerMenz, Michael, MD      . pantoprazole (PROTONIX) EC tablet 40 mg  40 mg Oral Daily Kennedy BuckerMenz, Michael, MD      . rosuvastatin (CRESTOR) tablet 10 mg  10 mg Oral Daily Kennedy BuckerMenz, Michael, MD      . senna-docusate (Senokot-S) tablet 1 tablet  1 tablet Oral QHS  Kennedy BuckerMenz, Michael, MD      .  sevelamer carbonate (RENVELA) tablet 800 mg  800 mg Oral TID WC Kennedy Bucker, MD      . tamsulosin [facility]) capsule 0.4 mg  0.4 mg Oral Daily Kennedy Bucker, MD      . thiamine (VITAMIN B-1) tablet 100 mg  100 mg Oral Daily Kennedy Bucker, MD      . traMADol Janean Sark) tablet 50 mg  50 mg Oral Q6H PRN Kennedy Bucker, MD      . traZODone (DESYREL) tablet 25 mg  25 mg Oral QHS PRN Kennedy Bucker, MD         Discharge Medications: Please see discharge summary for a list of discharge medications.  Relevant Imaging Results:  Relevant Lab Results:   Additional Information SS#[id number]  Judi Cong, LCSW

## 2018-04-08 NOTE — Anesthesia Post-op Follow-up Note (Signed)
Anesthesia QCDR form completed.        

## 2018-04-09 ENCOUNTER — Inpatient Hospital Stay: Payer: Medicare (Managed Care)

## 2018-04-09 LAB — GLUCOSE, CAPILLARY
Glucose-Capillary: 134 mg/dL — ABNORMAL HIGH (ref 70–99)
Glucose-Capillary: 164 mg/dL — ABNORMAL HIGH (ref 70–99)
Glucose-Capillary: 149 mg/dL — ABNORMAL HIGH (ref 70–99)
Glucose-Capillary: 170 mg/dL — ABNORMAL HIGH (ref 70–99)

## 2018-04-09 LAB — BASIC METABOLIC PANEL
Anion gap: 11 (ref 5–15)
BUN: 39 mg/dL — ABNORMAL HIGH (ref 8–23)
CO2: 27 mmol/L (ref 22–32)
Calcium: 9.2 mg/dL (ref 8.9–10.3)
Chloride: 99 mmol/L (ref 98–111)
Creatinine, Ser: 3.64 mg/dL — ABNORMAL HIGH (ref 0.61–1.24)
GFR calc Af Amer: 19 mL/min — ABNORMAL LOW (ref >60)
GFR calc non Af Amer: 16 mL/min — ABNORMAL LOW (ref >60)
Glucose, Bld: 133 mg/dL — ABNORMAL HIGH (ref 70–99)
Potassium: 4.6 mmol/L (ref 3.5–5.1)
Sodium: 137 mmol/L (ref 135–145)

## 2018-04-09 LAB — CBC
HEMATOCRIT: 30.4 % — AB (ref 40.0–52.0)
HEMOGLOBIN: 10.2 g/dL — AB (ref 13.0–18.0)
MCH: 33.1 pg (ref 26.0–34.0)
MCHC: 33.7 g/dL (ref 32.0–36.0)
MCV: 98.3 fL (ref 80.0–100.0)
Platelets: 162 10*3/uL (ref 150–440)
RBC: 3.09 MIL/uL — AB (ref 4.40–5.90)
RDW: 16.1 % — ABNORMAL HIGH (ref 11.5–14.5)
WBC: 8.2 10*3/uL (ref 3.8–10.6)

## 2018-04-09 MED ORDER — INSULIN ASPART 100 UNIT/ML ~~LOC~~ SOLN
0.0000 [IU] | Freq: Three times a day (TID) | SUBCUTANEOUS | Status: DC
  Administered 2018-04-09: 1 [IU] via SUBCUTANEOUS
  Administered 2018-04-09: 2 [IU] via SUBCUTANEOUS
  Administered 2018-04-10: 1 [IU] via SUBCUTANEOUS
  Administered 2018-04-10 – 2018-04-11 (×3): 2 [IU] via SUBCUTANEOUS
  Filled 2018-04-09 (×6): qty 1

## 2018-04-09 MED ORDER — CHLORHEXIDINE GLUCONATE CLOTH 2 % EX PADS
6.0000 | MEDICATED_PAD | Freq: Every day | CUTANEOUS | Status: DC
  Administered 2018-04-10: 6 via TOPICAL

## 2018-04-09 MED ORDER — FUROSEMIDE 10 MG/ML IJ SOLN
40.0000 mg | Freq: Two times a day (BID) | INTRAMUSCULAR | Status: DC
  Administered 2018-04-09 – 2018-04-11 (×4): 40 mg via INTRAVENOUS
  Filled 2018-04-09 (×4): qty 4

## 2018-04-09 NOTE — Progress Notes (Signed)
cpap refused 

## 2018-04-09 NOTE — Progress Notes (Signed)
Subjective: 1 Day Post-Op Procedure(s) (LRB): INTRAMEDULLARY (IM) NAIL FEMORAL (Left) Patient reports pain as mild to the left hip but reports moderate pain in the left heel. Unclear if injured it in the fall, no history of gout.  Pt is a diabetic. Patient is well, and has had no acute complaints or problems PT and care management to assist with discharge planning. Negative for chest pain and shortness of breath Fever: no Gastrointestinal:Negative for nausea and vomiting  Objective: Vital signs in last 24 hours: Temp:  [97.6 F (36.4 C)-99.6 F (37.6 C)] 98.6 F (37 C) (07/21 0727) Pulse Rate:  [61-76] 63 (07/21 0727) Resp:  [8-22] 18 (07/21 0727) BP: (126-158)/(63-75) 133/65 (07/21 0727) SpO2:  [78 %-98 %] 92 % (07/21 0807) FiO2 (%):  [60 %] 60 % (07/20 1145) Weight:  [85.3 kg (188 lb)] 85.3 kg (188 lb) (07/21 0429)  Intake/Output from previous day:  Intake/Output Summary (Last 24 hours) at 04/09/2018 0842 Last data filed at 04/09/2018 0642 Gross per 24 hour  Intake 620 ml  Output 50 ml  Net 570 ml    Intake/Output this shift: No intake/output data recorded.  Labs: Recent Labs    04/08/18 0050 04/08/18 0517 04/09/18 0448  HGB 11.0* 11.3* 10.2*   Recent Labs    04/08/18 0517 04/09/18 0448  WBC 13.8* 8.2  RBC 3.46* 3.09*  HCT 34.3* 30.4*  PLT 222 162   Recent Labs    04/08/18 0517 04/09/18 0448  NA 136 137  K 4.9 4.6  CL 99 99  CO2 26 27  BUN 26* 39*  CREATININE 2.77* 3.64*  GLUCOSE 152* 133*  CALCIUM 9.0 9.2   No results for input(s): LABPT, INR in the last 72 hours.   EXAM General - Patient is Alert, Appropriate and Oriented Extremity - ABD soft Sensation intact distally Intact pulses distally Dorsiflexion/Plantar flexion intact Incision: dressing C/D/I No cellulitis present Dressing/Incision - clean, dry, no drainage Motor Function - intact, moving foot and toes well on exam.   Pt is able to dorsiflex and plantarflex the foot, able to  invert and evert the ankle with mild pain.  Intact to light touch to the left foot in the peroneal and sural distribution.  Achilles tendon without defect, Thompson's test elicits passive plantar flexion.  Tender to palpation over the heel, no tenderness with palpation over the medial or lateral aspect of the ankle or dorsal and volar aspect of the foot.  Past Medical History:  Diagnosis Date  . Acute pericarditis    Probable, Echo (7/10) showed mild LVH, normal LV size and systolic function, EF 55-60%, normal wall motion, mild MR, no AS, partially loculated small effusion along the RA free wall  . Anemia   . CHF (congestive heart failure) (HCC)   . DDD (degenerative disc disease), lumbar    Spine  . Depression   . Diabetes mellitus type II    Poor control due to medication noncompliance. Last hgbA1c 13.2  . Edema   . Hepatitis, unspecified   . Hyperlipidemia   . Hyperparathyroidism (HCC)   . Hypertension    Cough with a BP med in teh past, sounds like ACEI  . Kidney stones    dialysis t,t,s  . Malnutrition (HCC)   . Sleep apnea    cpap  . Stroke (HCC)    02/2013 left side weakness    Assessment/Plan: 1 Day Post-Op Procedure(s) (LRB): INTRAMEDULLARY (IM) NAIL FEMORAL (Left) Active Problems:   Hip fx (HCC)  Estimated  body mass index is 25.5 kg/m as calculated from the following:   Height as of this encounter: 6' (1.829 m).   Weight as of this encounter: 85.3 kg (188 lb). Advance diet Up with therapy D/C IV fluids when tolerating po intake.  Labs reviewed this AM, Hg 10.2 this AM. CBC and BMP ordered for tomorrow morning. Will obtain x-ray of the left foot to ensure no bony abnormality. Up with therapy today. Begin working on having a BM.  DVT Prophylaxis - Aspirin, Foot Pumps and TED hose Weight-Bearing as tolerated to left leg  J. Horris LatinoLance Emmajo Bennette, PA-C [facility] 04/09/2018, 8:42 AM

## 2018-04-09 NOTE — Progress Notes (Signed)
[employer] - [place] at [facility]   PATIENT NAME: Nathan Freeman    MR#:  [id number]  DATE OF BIRTH:  11/24/1950  SUBJECTIVE:   Patient complaining of left ankle pain  REVIEW OF SYSTEMS:    Review of Systems  Constitutional: Negative for fever, chills weight loss HENT: Negative for ear pain, nosebleeds, congestion, facial swelling, rhinorrhea, neck pain, neck stiffness and ear discharge.   Respiratory: Negative for cough, shortness of breath, wheezing  Cardiovascular: Negative for chest pain, palpitations and leg swelling.  Gastrointestinal: Negative for heartburn, abdominal pain, vomiting, diarrhea or consitpation Genitourinary: Negative for dysuria, urgency, frequency, hematuria Musculoskeletal: Negative for back pain  Left ankle pain Neurological: Negative for dizziness, seizures, syncope, focal weakness,  numbness and headaches.  Hematological: Does not bruise/bleed easily.  Psychiatric/Behavioral: Negative for hallucinations, confusion, dysphoric mood    Tolerating Diet: yes      DRUG ALLERGIES:  No Known Allergies  VITALS:  Blood pressure 133/65, pulse 63, temperature 98.6 F (37 C), temperature source Oral, resp. rate 18, height 6' (1.829 m), weight 85.3 kg (188 lb), SpO2 92 %.  PHYSICAL EXAMINATION:  Constitutional: Appears well-developed and well-nourished. No distress. HENT: Normocephalic. Marland Kitchen. Oropharynx is clear and moist.  Eyes: Conjunctivae and EOM are normal. PERRLA, no scleral icterus.  Neck: Normal ROM. Neck supple. No JVD. No tracheal deviation. CVS: RRR, S1/S2 +, no murmurs, no gallops, no carotid bruit.  Pulmonary: Effort and breath sounds normal, no stridor, rhonchi, wheezes, rales.  Abdominal: Soft. BS +,  no distension, tenderness, rebound or guarding.  Musculoskeletal: Ankle with significant pain on palpation and decreased range of motion. No edema and no tenderness.  Neuro: Alert. CN 2-12 grossly intact. No focal  deficits. Skin: Skin is warm and dry. No rash noted. Psychiatric: Normal mood and affect.      LABORATORY PANEL:   CBC Recent Labs  Lab 04/09/18 0448  WBC 8.2  HGB 10.2*  HCT 30.4*  PLT 162   ------------------------------------------------------------------------------------------------------------------  Chemistries  Recent Labs  Lab 04/08/18 0050  04/09/18 0448  NA 138   < > 137  K 4.0   < > 4.6  CL 99   < > 99  CO2 28   < > 27  GLUCOSE 152*   < > 133*  BUN 25*   < > 39*  CREATININE 2.60*   < > 3.64*  CALCIUM 9.0   < > 9.2  AST 25  --   --   ALT 19  --   --   ALKPHOS 95  --   --   BILITOT 1.0  --   --    < > = values in this interval not displayed.   ------------------------------------------------------------------------------------------------------------------  Cardiac Enzymes No results for input(s): TROPONINI in the last 168 hours. ------------------------------------------------------------------------------------------------------------------  RADIOLOGY:  Dg Chest 1 View  Result Date: 04/08/2018 CLINICAL DATA:  Low oxygen saturation EXAM: CHEST  1 VIEW COMPARISON:  April 08, 2018 study obtained earlier in the day FINDINGS: There is now airspace consolidation throughout the mid and lower lung zones on the right. There is also consolidation in the medial left base. Heart is mildly enlarged with pulmonary vascularity normal. No adenopathy. No bone lesions. IMPRESSION: Multifocal pneumonia, considerably more on the right than on the left, a finding not present several hours previously. Stable cardiac prominence. No evident adenopathy. Electronically Signed   By: Bretta BangWilliam  Woodruff III M.D.   On: 04/08/2018 14:14   [facility]  Result Date: 04/08/2018 CLINICAL DATA:  Preoperative evaluation prior to ORIF. EXAM: PORTABLE CHEST 1 VIEW COMPARISON:  Prior radiograph from 01/09/2018. FINDINGS: Moderate cardiomegaly, stable. Mediastinal silhouette within  normal limits. Lungs normally inflated. No focal infiltrates. Mild pulmonary interstitial congestion without pulmonary edema. No pleural effusion. No pneumothorax. No acute osseus abnormality. IMPRESSION: 1. Cardiomegaly with associated mild diffuse pulmonary interstitial congestion without overt pulmonary edema. 2. No other active cardiopulmonary disease. Electronically Signed   By: Rise Mu M.D.   On: 04/08/2018 01:22   Dg Hip Operative Unilat W Or W/o Pelvis Left  Result Date: 04/08/2018 CLINICAL DATA:  Left intertrochanteric femur fracture EXAM: OPERATIVE LEFT HIP (WITH PELVIS IF PERFORMED) 3 VIEWS TECHNIQUE: Fluoroscopic spot image(s) were submitted for interpretation post-operatively. COMPARISON:  Earlier films of the same day FINDINGS: 3 fluoroscopic spot images document IM rod and interlocking sliding screw fixation across the left femoral intertrochanteric fracture. A single distal interlocking screw is noted. Fracture fragments in near anatomic alignment. IMPRESSION: Internal fixation of left intertrochanteric femur fracture. Electronically Signed   By: Corlis Leak M.D.   On: 04/08/2018 09:50   Dg Hip Unilat With Pelvis 2-3 Views Left  Result Date: 04/08/2018 CLINICAL DATA:  Initial evaluation for acute trauma, fall. EXAM: DG HIP (WITH OR WITHOUT PELVIS) 2-3V LEFT COMPARISON:  None. FINDINGS: Acute comminuted intertrochanteric fracture of the left femur with minimal displacement. Left femoral head remains normally position within the acetabulum. Femoral head height maintained. Bony pelvis intact. SI joints appear ankylosed. 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 Degenerative changes noted within the lower lumbar spine. IMPRESSION: Acute comminuted intertrochanteric fracture of the left femur. Electronically Signed   By: Rise Mu M.D.   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  1.  Acute comminuted intertrochanteric fracture left femur: Patient is postoperative day #1 intramedullary nail. DVT prophylaxis as per orthopedic surgery aspirin. Continue PT and PRN pain medications  2.  Left ankle pain: Check x-rays to evaluate for fracture  3.  Essential hypertension: Continue lisinopril, Coreg, Norvasc  4.  Depression: Continue Wellbutrin and Celexa  5.  Hyperlipidemia: Continue Crestor  6.  End-stage renal disease on hemodialysis: Continued dialysis Monday, Wednesday and Friday.  I will call Dr. Wynelle Link and see if patient would benefit from dialysis today.  7.  Acute hypoxic respiratory failure in the setting of community acquired pneumonia and combined systolic and diastolic heart failure: Continue Zosyn day 2 of 7 and possible dialysis today. Continue Lasix 40 IV every 12 Wean to RA as tolerated     Management plans discussed with the patient and family and they are in agreement.  CODE STATUS: limited no MV  TOTAL TIME TAKING CARE OF THIS PATIENT: 30 minutes.     POSSIBLE D/C 1-2 days, DEPENDING ON CLINICAL CONDITION.   Corrissa Martello M.D on 04/09/2018 at 9:52 AM  Between 7am to 6pm - Pager - [phone] After 6pm go to [email or web address] - Social research officer, government  [facility]  Office  [phone]  CC: Primary care physician; [employer]  Note: This dictation was prepared with Nurse, children's dictation along with smaller Lobbyist. Any transcriptional errors that result from this process are unintentional.

## 2018-04-09 NOTE — Evaluation (Signed)
Physical Therapy Evaluation Patient Details Name: Nathan Freeman MRN: [id number] DOB: 01/19/1951 Today's Date: 04/09/2018   History of Present Illness  Nathan Freeman is a 67yo male who [facility] pain. Pt sustained a Lt hip fracture, is now s/p Lt hip ORIF with IM rod and WBAT. [facility], but planning on returning to home in 2 weeks.   Clinical Impression  Pt admitted with above diagnosis. Pt currently with functional limitations due to the deficits listed below (see "PT Problem List"). Upon entry, pt in bed, daughter present at beginning of eval. The pt is awake and agreeable to participate. The pt is alert and oriented x3, pleasant, conversational, and following simple commands consistently.  Pt initially reporting Left ankle 10/10 pain upon entry (xrays unremarkable for fracture), and 0/10 pain at operative site, but once exercise is initiated, 0/10 ankle pain and 4/10 pain in mid anterior thigh: curious for some post CVA related difficulty with proprioception in this spastic hemiplegic limb. Pt has chronic ROM restrictions and weakness in the LLE, which are worse this date per patient: exam reveals 3/5 LLE flexion strength, and 1/5 extension strength. RLE is also somewhat weak, grossly 4/5. Functional mobility assessment demonstrates increased effort/time requirements, poor tolerance, and need for physical assistance, whereas the patient performed these at a higher level of independence PTA. Pt will benefit from skilled PT intervention to increase independence and safety with basic mobility in preparation for discharge to the venue listed below.       Follow Up Recommendations SNF;Supervision/Assistance - 24 hour    Equipment Recommendations  None recommended by PT    Recommendations for Other Services       Precautions / Restrictions Precautions Precautions: Fall Restrictions LLE Weight  Bearing: Weight bearing as tolerated      Mobility  Bed Mobility Overal bed mobility: Needs Assistance Bed Mobility: Supine to Sit     Supine to sit: Mod assist     General bed mobility comments: Pt tries on his own despite large pain increase, but ultiamtely does need help for vertical trunk and supoprt for 90 seconds untl he can self stabilize his trunk.   Transfers Overall transfer level: Needs assistance Equipment used: None Transfers: Stand Pivot Transfers   Stand pivot transfers: Max assist;From elevated surface;+2 physical assistance       General transfer comment: pain limiting; Left foot never touches ground d/t contractures.  Ambulation/Gait                Stairs            Wheelchair Mobility    Modified Rankin (Stroke Patients Only)       Balance Overall balance assessment: History of Falls;Mild deficits observed, not formally tested;Needs assistance                                           Pertinent Vitals/Pain Pain Assessment: 0-10 Pain Score: 4  Pain Location: Left mid thigh. (10/10 ankle pain upon arrival with insidious resoution after inititation after exercise commencement)  Pain Descriptors / Indicators: Aching(stinging) Pain Intervention(s): Limited activity within patient's tolerance;Monitored during session    Home Living Family/patient expects to be discharged to:: Skilled nursing facility  Prior Function Level of Independence: Needs assistance   Gait / Transfers Assistance Needed: Pt ambulates RW with PT, distances less than 179ft.   Pt has had multiple falls in the past 6 months.   ADL's / Homemaking Assistance Needed: Pt has been requiring assist with ADL  Comments: Pt is a PACE participant.     Hand Dominance   Dominant Hand: Right    Extremity/Trunk Assessment        Lower Extremity Assessment Lower Extremity Assessment: LLE deficits/detail LLE Deficits /  Details: Chronic ankle, knee, and hip contractures, today limited in plantarflexion, and lacks >30degress of TKE. Hip range testing limited d/t pain  LLE: Unable to fully assess due to pain LLE Sensation: WNL(questionable proprioception deficits, should be tested. )       Communication   Communication: No difficulties  Cognition Arousal/Alertness: Awake/alert Behavior During Therapy: WFL for tasks assessed/performed Overall Cognitive Status: Within Functional Limits for tasks assessed                                        General Comments      Exercises General Exercises - Lower Extremity Heel Slides: AAROM;Supine;10 reps;Left Hip ABduction/ADduction: AAROM;Left;5 reps;Supine(limited by increase in pain with adduction of hip) Straight Leg Raises: AROM;10 reps;Right Mini-Sqauts: Supine;AROM;Left;10 reps(unable to detect resistance)   Assessment/Plan    PT Assessment Patient needs continued PT services  PT Problem List Decreased strength;Decreased range of motion;Decreased activity tolerance;Decreased mobility;Decreased balance;Pain       PT Treatment Interventions DME instruction;Balance training;Gait training;Functional mobility training;Therapeutic activities;Therapeutic exercise;Patient/family education    PT Goals (Current goals can be found in the Care Plan section)  Acute Rehab PT Goals Patient Stated Goal: return to AMB c RW  PT Goal Formulation: With patient Time For Goal Achievement: 04/23/18 Potential to Achieve Goals: Good    Frequency BID   Barriers to discharge        Co-evaluation               AM-PAC PT "6 Clicks" Daily Activity  Outcome Measure Difficulty turning over in bed (including adjusting bedclothes, sheets and blankets)?: Unable Difficulty moving from lying on back to sitting on the side of the bed? : Unable Difficulty sitting down on and standing up from a chair with arms (e.g., wheelchair, bedside commode, etc,.)?:  Unable Help needed moving to and from a bed to chair (including a wheelchair)?: Total Help needed walking in hospital room?: Total Help needed climbing 3-5 steps with a railing? : Total 6 Click Score: 6    End of Session   Activity Tolerance: Patient limited by pain Patient left: in chair;with SCD's reapplied;with chair alarm set Nurse Communication: Mobility status PT Visit Diagnosis: Difficulty in walking, not elsewhere classified (R26.2);Other abnormalities of gait and mobility (R26.89);History of falling (Z91.81);Hemiplegia and hemiparesis Hemiplegia - Right/Left: Left Hemiplegia - dominant/non-dominant: Non-dominant Hemiplegia - caused by: Cerebral infarction    Time: 1610-9604 PT Time Calculation (min) (ACUTE ONLY): 42 min   Charges:   PT Evaluation $PT Eval High Complexity: 1 High PT Treatments $Therapeutic Exercise: 8-22 mins $Therapeutic Activity: 8-22 mins   PT G Codes:        1:44 PM, 29-Apr-2018 Rosamaria Lints, PT, DPT Physical Therapist - [facility]  [phone] (ASCOM)    Naziyah Tieszen C Apr 29, 2018, 1:40 PM

## 2018-04-09 NOTE — Progress Notes (Signed)
[facility]  ROUNDING NOTE   Subjective:   Patient diagnosed with multilobar pneumonia. Placed on HFNC.   Post op day #1   Objective:  Vital signs in last 24 hours:  Temp:  [97.6 F (36.4 C)-99.6 F (37.6 C)] 98.6 F (37 C) (07/21 0727) Pulse Rate:  [61-76] 63 (07/21 0727) Resp:  [16-19] 18 (07/21 0727) BP: (126-158)/(65-74) 133/65 (07/21 0727) SpO2:  [78 %-98 %] 92 % (07/21 0807) FiO2 (%):  [60 %] 60 % (07/20 1145) Weight:  [85.3 kg (188 lb)] 85.3 kg (188 lb) (07/21 0429)  Weight change: -30.8 kg (-68 lb) Filed Weights   04/08/18 0045 04/08/18 0431 04/09/18 0429  Weight: 116.1 kg (256 lb) 73.9 kg (163 lb) 85.3 kg (188 lb)    Intake/Output: I/O last 3 completed shifts: In: 673.8 [P.O.:120; I.V.:503.8; IV Piggyback:50] Out: 50 [Blood:50]   Intake/Output this shift:  Total I/O In: 480 [P.O.:480] Out: -   Physical Exam: General: NAD, laying in bed  Head: Normocephalic, atraumatic. Moist oral mucosal membranes  Eyes: Anicteric, PERRL  Neck: Supple, trachea midline  Lungs:  Clear to auscultation  Heart: Regular rate and rhythm  Abdomen:  Soft, nontender,   Extremities: no peripheral edema.  Neurologic: Nonfocal, moving all four extremities  Skin: No lesions  Access: Left AVF    Basic Metabolic Panel: Recent Labs  Lab 04/08/18 0050 04/08/18 0517 04/09/18 0448  NA 138 136 137  K 4.0 4.9 4.6  CL 99 99 99  CO2 28 26 27   GLUCOSE 152* 152* 133*  BUN 25* 26* 39*  CREATININE 2.60* 2.77* 3.64*  CALCIUM 9.0 9.0 9.2    Liver Function Tests: Recent Labs  Lab 04/08/18 0050  AST 25  ALT 19  ALKPHOS 95  BILITOT 1.0  PROT 7.1  ALBUMIN 3.5   No results for input(s): LIPASE, AMYLASE in the last 168 hours. No results for input(s): AMMONIA in the last 168 hours.  CBC: Recent Labs  Lab 04/08/18 0050 04/08/18 0517 04/09/18 0448  WBC 5.6 13.8* 8.2  NEUTROABS 4.5  --   --   HGB 11.0* 11.3* 10.2*  HCT 32.6* 34.3* 30.4*  MCV 96.3 99.0 98.3   PLT 198 222 162    Cardiac Enzymes: No results for input(s): CKTOTAL, CKMB, CKMBINDEX, TROPONINI in the last 168 hours.  BNP: Invalid input(s): POCBNP  CBG: Recent Labs  Lab 04/08/18 0946 04/09/18 0748  GLUCAP 166* 134*    Microbiology: Results for orders placed or performed during the hospital encounter of 04/08/18  Surgical pcr screen     Status: Abnormal   Collection Time: 04/08/18  3:16 AM  Result Value Ref Range Status   MRSA, PCR NEGATIVE NEGATIVE Final   Staphylococcus aureus POSITIVE (A) NEGATIVE Final    Comment: (NOTE) The Xpert SA Assay (FDA approved for NASAL specimens in patients 8 years of age and older), is one component of a comprehensive surveillance program. It is not intended to diagnose infection nor to guide or monitor treatment. Performed at [facility], [address] [place], [place] [address]     Coagulation Studies: No results for input(s): LABPROT, INR in the last 72 hours.  Urinalysis: No results for input(s): COLORURINE, LABSPEC, PHURINE, GLUCOSEU, HGBUR, BILIRUBINUR, KETONESUR, PROTEINUR, UROBILINOGEN, NITRITE, LEUKOCYTESUR in the last 72 hours.  Invalid input(s): APPERANCEUR    Imaging: Dg Chest 1 View  Result Date: 04/09/2018 CLINICAL DATA:  Pneumonia EXAM: CHEST  1 VIEW COMPARISON:  04/08/2018 FINDINGS: Stable cardiomegaly without associated  edema. Similar patchy bilateral airspace opacities in the right mid lung and left lower lobe. No enlarging effusion or pneumothorax. Trachea is midline. Degenerative changes of the spine. IMPRESSION: Stable bilateral airspace process consistent with multifocal pneumonia. Electronically Signed   By: Judie Petit.  Shick M.D.   On: 04/09/2018 10:40   Dg Chest 1 View  Result Date: 04/08/2018 CLINICAL DATA:  Low oxygen saturation EXAM: CHEST  1 VIEW COMPARISON:  April 08, 2018 study obtained earlier in the day FINDINGS: There is now airspace consolidation throughout the mid and lower lung zones on  the right. There is also consolidation in the medial left base. Heart is mildly enlarged with pulmonary vascularity normal. No adenopathy. No bone lesions. IMPRESSION: Multifocal pneumonia, considerably more on the right than on the left, a finding not present several hours previously. Stable cardiac prominence. No evident adenopathy. Electronically Signed   By: Bretta Bang III M.D.   On: 04/08/2018 14:14   Dg Chest Port 1 View  Result Date: 04/08/2018 CLINICAL DATA:  Preoperative evaluation prior to ORIF. EXAM: PORTABLE CHEST 1 VIEW COMPARISON:  Prior radiograph from 01/09/2018. FINDINGS: Moderate cardiomegaly, stable. Mediastinal silhouette within normal limits. Lungs normally inflated. No focal infiltrates. Mild pulmonary interstitial congestion without pulmonary edema. No pleural effusion. No pneumothorax. No acute osseus abnormality. IMPRESSION: 1. Cardiomegaly with associated mild diffuse pulmonary interstitial congestion without overt pulmonary edema. 2. No other active cardiopulmonary disease. Electronically Signed   By: Rise Mu M.D.   On: 04/08/2018 01:22   Dg Hip Operative Unilat W Or W/o Pelvis Left  Result Date: 04/08/2018 CLINICAL DATA:  Left intertrochanteric femur fracture EXAM: OPERATIVE LEFT HIP (WITH PELVIS IF PERFORMED) 3 VIEWS TECHNIQUE: Fluoroscopic spot image(s) were submitted for interpretation post-operatively. COMPARISON:  Earlier films of the same day FINDINGS: 3 fluoroscopic spot images document IM rod and interlocking sliding screw fixation across the left femoral intertrochanteric fracture. A single distal interlocking screw is noted. Fracture fragments in near anatomic alignment. IMPRESSION: Internal fixation of left intertrochanteric femur fracture. Electronically Signed   By: Corlis Leak M.D.   On: 04/08/2018 09:50   Dg Hip Unilat With Pelvis 2-3 Views Left  Result Date: 04/08/2018 CLINICAL DATA:  Initial evaluation for acute trauma, fall. EXAM: DG HIP  (WITH OR WITHOUT PELVIS) 2-3V LEFT COMPARISON:  None. FINDINGS: Acute comminuted intertrochanteric fracture of the left femur with minimal displacement. Left femoral head remains normally position within the acetabulum. Femoral head height maintained. Bony pelvis intact. SI joints appear ankylosed. 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 Degenerative changes noted within the lower lumbar spine. IMPRESSION: Acute comminuted intertrochanteric fracture of the left femur. Electronically Signed   By: Rise Mu M.D.   On: 04/08/2018 01:20     Medications:   . methocarbamol (ROBAXIN)  IV    . piperacillin-tazobactam (ZOSYN)  IV 3.375 g (04/09/18 1025)   . acetaminophen  500 mg Oral Q6H  . amLODipine  10 mg Oral Daily  . aspirin EC  325 mg Oral Q breakfast  . buPROPion  150 mg Oral Daily  . [START ON 04/10/2018] calcitRIOL  0.25 mcg Oral Once per day on Mon Wed Fri  . carvedilol  25 mg Oral BID WC  . citalopram  20 mg Oral Daily  . docusate sodium  100 mg Oral BID  . furosemide  40 mg Intravenous Q12H  . insulin aspart  0-9 Units Subcutaneous TID WC  . lisinopril  10 mg Oral Daily  . omega-3 acid ethyl esters  2 capsule Oral Q supper  . pantoprazole  40 mg Oral Daily  . rosuvastatin  10 mg Oral Daily  . senna-docusate  1 tablet Oral QHS  . sevelamer carbonate  800 mg Oral TID WC  . tamsulosin  0.4 mg Oral Daily  . thiamine  100 mg Oral Daily   [DISCONTINUED] acetaminophen **OR** acetaminophen, acetaminophen, albuterol, alum & mag hydroxide-simeth, bisacodyl, bisacodyl, fluticasone, HYDROcodone-acetaminophen, HYDROcodone-acetaminophen, HYDROmorphone (DILAUDID) injection, magnesium citrate, magnesium hydroxide, menthol-cetylpyridinium **OR** phenol, methocarbamol **OR** methocarbamol (ROBAXIN)  IV, metoCLOPramide **OR** metoCLOPramide (REGLAN) injection, morphine injection, ondansetron **OR** ondansetron (ZOFRAN) IV, traMADol, traZODone  Assessment/ Plan:  [name] is a 67 y.o. white male end stage renal disease on hemodialysis MWF, hypertension, CVA with left sided weakness, obstructive sleep apnea, hyperlipidemia, diabetes mellitus type II, congestive heart failure, pericarditis [facility] hip fracture. Status post surgery on 7/20 by Dr. Rosita KeaMenz.   [facility]  1. End stage renal disease: no indication for dialysis today.   Next treatment scheduled for Monday.   2. Hypertension: blood pressure at goal. Home regimen of carvedilol, lisinopril and amlodipine.   3. Anemia of chronic kidney disease: hemoglobin 10.2 - Mircera as outpatient.   4. Secondary Hyperparathyroidism: calcium at goal - sevelamer with meals.  - Calcitriol on dialysis days.    LOS: 1 Biana Haggar 7/21/201911:32 AM

## 2018-04-10 ENCOUNTER — Encounter
Admission: RE | Admit: 2018-04-10 | Discharge: 2018-04-10 | Disposition: A | Discharge status: Home / Self Care | Payer: Medicare (Managed Care) | Source: Ambulatory Visit | Attending: Internal Medicine | Admitting: Internal Medicine

## 2018-04-10 ENCOUNTER — Encounter: Payer: Self-pay | Admitting: Orthopedic Surgery

## 2018-04-10 LAB — CBC
HEMATOCRIT: 29 % — AB (ref 40.0–52.0)
Hemoglobin: 9.7 g/dL — ABNORMAL LOW (ref 13.0–18.0)
MCH: 32.8 pg (ref 26.0–34.0)
MCHC: 33.6 g/dL (ref 32.0–36.0)
MCV: 97.7 fL (ref 80.0–100.0)
PLATELETS: 148 10*3/uL — AB (ref 150–440)
RBC: 2.97 MIL/uL — AB (ref 4.40–5.90)
RDW: 15.7 % — AB (ref 11.5–14.5)
WBC: 8.2 10*3/uL (ref 3.8–10.6)

## 2018-04-10 LAB — GLUCOSE, CAPILLARY
GLUCOSE-CAPILLARY: 148 mg/dL — AB (ref 70–99)
Glucose-Capillary: 173 mg/dL — ABNORMAL HIGH (ref 70–99)
Glucose-Capillary: 148 mg/dL — ABNORMAL HIGH (ref 70–99)

## 2018-04-10 MED ORDER — ASPIRIN 325 MG PO TBEC: 325.0000 mg | DELAYED_RELEASE_TABLET | Freq: Every day | ORAL | 0 refills | Status: AC

## 2018-04-10 MED ORDER — AMOXICILLIN-POT CLAVULANATE 875-125 MG PO TABS: 1.0000 | ORAL_TABLET | Freq: Two times a day (BID) | ORAL | 0 refills | Status: DC

## 2018-04-10 MED ORDER — HYDROCODONE-ACETAMINOPHEN 5-325 MG PO TABS: 1.0000 | ORAL_TABLET | Freq: Two times a day (BID) | ORAL | 0 refills | Status: AC

## 2018-04-10 MED ORDER — EPOETIN ALFA 10000 UNIT/ML IJ SOLN
4000.0000 [IU] | Freq: Once | INTRAMUSCULAR | Status: AC
  Administered 2018-04-10: 4000 [IU] via INTRAVENOUS

## 2018-04-10 NOTE — Evaluation (Addendum)
Clinical/Bedside Swallow Evaluation Patient Details  Name: Nathan Freeman MRN: [id number] Date of Birth: 08/05/1951  Today's Date: 04/10/2018 Time: SLP Start Time (ACUTE ONLY): 1530 SLP Stop Time (ACUTE ONLY): 1617 SLP Time Calculation (min) (ACUTE ONLY): 47 min  Past Medical History:  Past Medical History:  Diagnosis Date  . Acute pericarditis    Probable, Echo (7/10) showed mild LVH, normal LV size and systolic function, EF 55-60%, normal wall motion, mild MR, no AS, partially loculated small effusion along the RA free wall  . Anemia   . CHF (congestive heart failure) (HCC)   . DDD (degenerative disc disease), lumbar    Spine  . Depression   . Diabetes mellitus type II    Poor control due to medication noncompliance. Last hgbA1c 13.2  . Edema   . Hepatitis, unspecified   . Hyperlipidemia   . Hyperparathyroidism (HCC)   . Hypertension    Cough with a BP med in teh past, sounds like ACEI  . Kidney stones    dialysis t,t,s  . Malnutrition (HCC)   . Sleep apnea    cpap  . Stroke [facility])    02/2013 left side weakness   Past Surgical History:  Past Surgical History:  Procedure Laterality Date  . AV FISTULA PLACEMENT Left 12/11/2015   Procedure: ARTERIOVENOUS (AV) FISTULA CREATION ,BRACHIOCEPHALIC;  Surgeon: Annice Needy, MD;  Location: [facility] ORS;  Service: Vascular;  Laterality: Left;  . COLONOSCOPY    . COLONOSCOPY WITH PROPOFOL N/A 05/17/2016   Procedure: COLONOSCOPY WITH PROPOFOL;  Surgeon: Christena Deem, MD;  Location: [facility];  Service: Endoscopy;  Laterality: N/A;  . FEMUR IM NAIL Left 04/08/2018   Procedure: INTRAMEDULLARY (IM) NAIL FEMORAL;  Surgeon: Kennedy Bucker, MD;  Location: [facility] ORS;  Service: Orthopedics;  Laterality: Left;  . INSERTION OF DIALYSIS CATHETER Right    chest  . LITHOTRIPSY    . ROTATOR CUFF REPAIR     Left shoulder, 2 times  . TONSILLECTOMY     HPI:  Pt is a 67 y/o male w/ PMH including Acute pericarditis, DM, HTN, stroke, malnutrition,  end-stage renal disease on hemodialysis, CHF, depression, hepatitis, cough w/ ACEI per chart who comes to [facility] after a mechanical fall at [facility] and subsequent Left hip pain. Pt sustained a Lt hip fracture, is now s/p Lt hip ORIF with IM rod and WBAT. Pt was at [facility] for several weaks following ICH, but planning on returning to home in 2 weeks.   Assessment / Plan / Recommendation Clinical Impression  Pt appears to present w/ adequate oropharyngeal phase swallow function w/ reduced risk for aspiration when following general aspiration precautions. Pt required careful positioning in bed d/t recent hip fx (discomfort) and could not fully sit upright - a towel roll was utilized to support head more forward. Pt consumed po trials w/ no immediate, overt s/s of aspiration noted; no cough or decline in vocal quality. Respiratory status remained stable as at baseline. Oral phase appeared grossly wfl for bolus management of the trials given. Min time taken to fully clear the trials; pt stated he "liked" the ice cream/cracker trials. Pt consumed thin liquids via straw w/ no overt s/s of aspiration noted; clear vocal quality followed. Pt required setup of foods/drink and support w/ the feeding d/t overall weakness. Pt was A/O w/ SLP for follow through w/ po tasks. Recommend continue a regular diet w/ thin liquids diet w/ general aspiration precautions; Pills in Puree for safer swallowing if needed.  Support at all meals w/ eating/drinking and positioning upright for po's to lessen risk for choking/aspiration w/ po's if eating/drinking lying reclined. Recommend Dietician f/u for support if needed. Pt appears at his baseline. No further skilled ST services indicated at this time. NSG to reconsult if needed while admitted.  SLP Visit Diagnosis: Dysphagia, unspecified (R13.10)    Aspiration Risk  (reduced )    Diet Recommendation  Regular diet w/ thin liquids; general aspiration precautions; positioning support at all meals  for upright/head forward positioning  Medication Administration: Whole meds with liquid(as tolerates or Whole w/ puree)    Other  Recommendations Recommended Consults: (dietician f/u) Oral Care Recommendations: Oral care BID;Patient independent with oral care;Other (Comment)(assist as needed) Other Recommendations: (none)   Follow up Recommendations None      Frequency and Duration (n/a)  (n/a)       Prognosis Prognosis for Safe Diet Advancement: Good Barriers to Reach Goals: (n/a)      Swallow Study   General Date of Onset: 04/08/18 HPI: Pt is a 67 y/o male w/ PMH including Acute pericarditis, DM, HTN, stroke, malnutrition, end-stage renal disease on hemodialysis, CHF, depression, hepatitis, cough w/ ACEI per chart who [facility] and subsequent Left hip pain. Pt sustained a Lt hip fracture, is now s/p Lt hip ORIF with IM rod and WBAT. [facility], but planning on returning to home in 2 weeks. Type of Study: Bedside Swallow Evaluation Previous Swallow Assessment: none reported Diet Prior to this Study: Regular;Thin liquids Temperature Spikes Noted: No(wbc 8.2) Respiratory Status: Nasal cannula(3 liters) History of Recent Intubation: No Behavior/Cognition: Alert;Cooperative;Pleasant mood;Distractible;Requires cueing(min) Oral Cavity Assessment: Dry Oral Care Completed by SLP: Yes Oral Cavity - Dentition: Adequate natural dentition Vision: Functional for self-feeding Self-Feeding Abilities: Able to feed self;Needs assist;Needs set up(challenges w/ positioning) Patient Positioning: Postural control adequate for testing(inhibited d/t hip pain - NSG addressing w/ medication) Baseline Vocal Quality: Normal Volitional Cough: Strong Volitional Swallow: Able to elicit    Oral/Motor/Sensory Function Overall Oral Motor/Sensory Function: Within functional limits   Ice Chips Ice chips: Not tested   Thin Liquid Thin  Liquid: Within functional limits Presentation: Self Fed;Straw(~4 ozs)    Nectar Thick Nectar Thick Liquid: Not tested   Honey Thick Honey Thick Liquid: Not tested   Puree Puree: Within functional limits Presentation: Self Fed;Spoon(assisted; ~3 ozs total)   Solid   GO   Solid: Within functional limits Presentation: Spoon;Self Fed(5 trials)          Jerilynn SomKatherine Watson, MS, CCC-SLP Watson,Katherine 04/10/2018,4:26 PM

## 2018-04-10 NOTE — Progress Notes (Signed)
Subjective: 2 Days Post-Op Procedure(s) (LRB): INTRAMEDULLARY (IM) NAIL FEMORAL (Left) Patient reports pain as mild to the left hip but reports moderate pain in the left heel. X-rays of the left foot negative for acute fracture, does show heel spurs. Patient is well, and has had no acute complaints or problems Plan at this time is for SNF. Negative for chest pain and shortness of breath Fever: no Gastrointestinal:Negative for nausea and vomiting  Objective: Vital signs in last 24 hours: Temp:  [98.2 F (36.8 C)-98.7 F (37.1 C)] 98.7 F (37.1 C) (07/22 0722) Pulse Rate:  [59-69] 69 (07/22 0722) Resp:  [16-18] 16 (07/22 0722) BP: (125-133)/(62-70) 131/62 (07/22 0722) SpO2:  [84 %-98 %] 97 % (07/22 0722) Weight:  [80.7 kg (178 lb)] 80.7 kg (178 lb) (07/22 0500)  Intake/Output from previous day:  Intake/Output Summary (Last 24 hours) at 04/10/2018 0758 Last data filed at 04/10/2018 0500 Gross per 24 hour  Intake 1300 ml  Output 200 ml  Net 1100 ml    Intake/Output this shift: No intake/output data recorded.  Labs: Recent Labs    04/08/18 0050 04/08/18 0517 04/09/18 0448 04/10/18 0456  HGB 11.0* 11.3* 10.2* 9.7*   Recent Labs    04/09/18 0448 04/10/18 0456  WBC 8.2 8.2  RBC 3.09* 2.97*  HCT 30.4* 29.0*  PLT 162 148*   Recent Labs    04/08/18 0517 04/09/18 0448  NA 136 137  K 4.9 4.6  CL 99 99  CO2 26 27  BUN 26* 39*  CREATININE 2.77* 3.64*  GLUCOSE 152* 133*  CALCIUM 9.0 9.2   No results for input(s): LABPT, INR in the last 72 hours.   EXAM General - Patient is Alert, Appropriate and Oriented Extremity - ABD soft Sensation intact distally Intact pulses distally Dorsiflexion/Plantar flexion intact Incision: dressing C/D/I No cellulitis present Dressing/Incision - clean, dry, no drainage Motor Function - intact, moving foot and toes well on exam.   Pt is able to dorsiflex and plantarflex the foot, able to invert and evert the ankle with mild  pain.  Intact to light touch to the left foot in the peroneal and sural distribution.  Achilles tendon without defect, Thompson's test elicits passive plantar flexion.  Tender to palpation over the heel, no tenderness with palpation over the medial or lateral aspect of the ankle or dorsal and volar aspect of the foot.  Past Medical History:  Diagnosis Date  . Acute pericarditis    Probable, Echo (7/10) showed mild LVH, normal LV size and systolic function, EF 55-60%, normal wall motion, mild MR, no AS, partially loculated small effusion along the RA free wall  . Anemia   . CHF (congestive heart failure) (HCC)   . DDD (degenerative disc disease), lumbar    Spine  . Depression   . Diabetes mellitus type II    Poor control due to medication noncompliance. Last hgbA1c 13.2  . Edema   . Hepatitis, unspecified   . Hyperlipidemia   . Hyperparathyroidism (HCC)   . Hypertension    Cough with a BP med in teh past, sounds like ACEI  . Kidney stones    dialysis t,t,s  . Malnutrition (HCC)   . Sleep apnea    cpap  . Stroke (HCC)    02/2013 left side weakness    Assessment/Plan: 2 Days Post-Op Procedure(s) (LRB): INTRAMEDULLARY (IM) NAIL FEMORAL (Left) Active Problems:   Hip fx (HCC)  Estimated body mass index is 24.14 kg/m as calculated from the following:  Height as of this encounter: 6' (1.829 m).   Weight as of this encounter: 80.7 kg (178 lb). Advance diet Up with therapy D/C IV fluids when tolerating po intake.  Labs reviewed this AM, Hg 9.7 this AM. CBC and BMP ordered for tomorrow morning. X-ray of the left foot negative for fracture, Heel spurs visible.  Will order CAM Walker boot for the left foot while up with therapy. Up with therapy today, PT currently recommending SNF. Begin working on having a BM.  DVT Prophylaxis - Aspirin, Foot Pumps and TED hose Weight-Bearing as tolerated to left leg  J. Horris LatinoLance Yanel Dombrosky, PA-C [facility] 04/10/2018, 7:58  AM

## 2018-04-10 NOTE — Progress Notes (Signed)
Pt. refusing Cpap

## 2018-04-10 NOTE — Progress Notes (Addendum)
Patient is medically stable for [facility]. Per Joni ReiningNicole with [facility] and [facility]. Per Ladona Ridgelaylor patient can return today after dialysis to room 219-B. RN will call report at [phone] and arrange EMS for transport. Cat and Joni Reiningicole with [facility] are aware of above. CSW faxed D/C summary to [facility] as requested. CSW [facility] a [facility]ic[facility] making her aware of above. Patient is aware of above. CSW left patient's daughter Aggie CosierCrystal and April voicemails. Please reconsult if future social work needs arise. CSW signing off.   Patient's daughter Aggie CosierCrystal called CSW back and was made aware of above.   [facility]iley Huberta Tompkins, LCSW [phone]

## 2018-04-10 NOTE — Progress Notes (Signed)
[facility]. [facility] [place], [place] 04/10/18  Subjective:   Patient seen prior to starting hemodialysis.  He reports that He has some pain in his left leg at the site of surgery appetite is good.  No nausea or vomiting No shortness of breath Physical therapy evaluation ongoing.  Limited participation due to left hip pain.   Objective:  Vital signs in last 24 hours:  Temp:  [98.2 F (36.8 C)-98.7 F (37.1 C)] (P) 98.4 F (36.9 C) (07/22 1033) Pulse Rate:  [59-69] (P) 63 (07/22 1033) Resp:  [16-18] (P) 16 (07/22 1033) BP: (109-133)/(51-74) 117/70 (07/22 1115) SpO2:  [84 %-100 %] 100 % (07/22 1033) Weight:  [80.7 kg (178 lb)] 80.7 kg (178 lb) (07/22 0500)  Weight change: -4.536 kg (-10 lb) Filed Weights   04/08/18 0431 04/09/18 0429 04/10/18 0500  Weight: 73.9 kg (163 lb) 85.3 kg (188 lb) 80.7 kg (178 lb)    Intake/Output:    Intake/Output Summary (Last 24 hours) at 04/10/2018 1141 Last data filed at 04/10/2018 1026 Gross per 24 hour  Intake 1300 ml  Output 200 ml  Net 1100 ml     Physical Exam: General:  Laying in the bed, no acute distress  HEENT  anicteric, moist oral mucous membranes  Neck  supple  Pulm/lungs  clear to auscultation bilaterally  CVS/Heart  regular rhythm  Abdomen:   Soft, nontender  Extremities:  Left hip bandage Access  Neurologic:  Alert oriented  Skin:  No acute rashes  Access:  Left arm AV fistula       Basic Metabolic Panel:  Recent Labs  Lab 04/08/18 0050 04/08/18 0517 04/09/18 0448  NA 138 136 137  K 4.0 4.9 4.6  CL 99 99 99  CO2 28 26 27   GLUCOSE 152* 152* 133*  BUN 25* 26* 39*  CREATININE 2.60* 2.77* 3.64*  CALCIUM 9.0 9.0 9.2     CBC: Recent Labs  Lab 04/08/18 0050 04/08/18 0517 04/09/18 0448 04/10/18 0456  WBC 5.6 13.8* 8.2 8.2  NEUTROABS 4.5  --   --   --   HGB 11.0* 11.3* 10.2* 9.7*  HCT 32.6* 34.3* 30.4* 29.0*  MCV 96.3 99.0 98.3 97.7  PLT 198 222 162 148*     No results found for:  HEPBSAG, HEPBSAB, HEPBIGM    Microbiology:  Recent Results (from the past 240 hour(s))  Surgical pcr screen     Status: Abnormal   Collection Time: 04/08/18  3:16 AM  Result Value Ref Range Status   MRSA, PCR NEGATIVE NEGATIVE Final   Staphylococcus aureus POSITIVE (A) NEGATIVE Final    Comment: (NOTE) The Xpert SA Assay (FDA approved for NASAL specimens in patients 66 years of age and older), is one component of a comprehensive surveillance program. It is not intended to diagnose infection nor to guide or monitor treatment. Performed at [facility], [address] [place], [place] [address]     Coagulation Studies: No results for input(s): LABPROT, INR in the last 72 hours.  Urinalysis: No results for input(s): COLORURINE, LABSPEC, PHURINE, GLUCOSEU, HGBUR, BILIRUBINUR, KETONESUR, PROTEINUR, UROBILINOGEN, NITRITE, LEUKOCYTESUR in the last 72 hours.  Invalid input(s): APPERANCEUR    Imaging: Dg Chest 1 View  Result Date: 04/09/2018 CLINICAL DATA:  Pneumonia EXAM: CHEST  1 VIEW COMPARISON:  04/08/2018 FINDINGS: Stable cardiomegaly without associated edema. Similar patchy bilateral airspace opacities in the right mid lung and left lower lobe. No enlarging effusion or pneumothorax. Trachea is midline. Degenerative changes of  the spine. IMPRESSION: Stable bilateral airspace process consistent with multifocal pneumonia. Electronically Signed   By: Judie Petit.  Shick M.D.   On: 04/09/2018 10:40   Dg Chest 1 View  Result Date: 04/08/2018 CLINICAL DATA:  Low oxygen saturation EXAM: CHEST  1 VIEW COMPARISON:  April 08, 2018 study obtained earlier in the day FINDINGS: There is now airspace consolidation throughout the mid and lower lung zones on the right. There is also consolidation in the medial left base. Heart is mildly enlarged with pulmonary vascularity normal. No adenopathy. No bone lesions. IMPRESSION: Multifocal pneumonia, considerably more on the right than on the left, a  finding not present several hours previously. Stable cardiac prominence. No evident adenopathy. Electronically Signed   By: Bretta Bang III M.D.   On: 04/08/2018 14:14   Dg Foot 2 Views Left  Result Date: 04/09/2018 CLINICAL DATA:  LEFT hip surgery EXAM: LEFT FOOT - 2 VIEW COMPARISON:  None. FINDINGS: No fracture or dislocation of mid foot or forefoot. The phalanges are normal. The calcaneus is normal. No soft tissue abnormality. Vascular calcifications noted IMPRESSION: No acute osseous abnormality. Electronically Signed   By: Genevive Bi M.D.   On: 04/09/2018 12:20     Medications:   . methocarbamol (ROBAXIN)  IV    . piperacillin-tazobactam (ZOSYN)  IV Stopped (04/10/18 0133)   . amLODipine  10 mg Oral Daily  . aspirin EC  325 mg Oral Q breakfast  . buPROPion  150 mg Oral Daily  . calcitRIOL  0.25 mcg Oral Once per day on Mon Wed Fri  . carvedilol  25 mg Oral BID WC  . Chlorhexidine Gluconate Cloth  6 each Topical Q0600  . citalopram  20 mg Oral Daily  . docusate sodium  100 mg Oral BID  . furosemide  40 mg Intravenous Q12H  . insulin aspart  0-9 Units Subcutaneous TID WC  . lisinopril  10 mg Oral Daily  . omega-3 acid ethyl esters  2 capsule Oral Q supper  . pantoprazole  40 mg Oral Daily  . rosuvastatin  10 mg Oral Daily  . senna-docusate  1 tablet Oral QHS  . sevelamer carbonate  800 mg Oral TID WC  . tamsulosin  0.4 mg Oral Daily  . thiamine  100 mg Oral Daily   [DISCONTINUED] acetaminophen **OR** acetaminophen, acetaminophen, albuterol, alum & mag hydroxide-simeth, bisacodyl, bisacodyl, fluticasone, HYDROcodone-acetaminophen, HYDROcodone-acetaminophen, HYDROmorphone (DILAUDID) injection, magnesium citrate, magnesium hydroxide, menthol-cetylpyridinium **OR** phenol, methocarbamol **OR** methocarbamol (ROBAXIN)  IV, metoCLOPramide **OR** metoCLOPramide (REGLAN) injection, morphine injection, ondansetron **OR** ondansetron (ZOFRAN) IV, traMADol,  traZODone  Assessment/ Plan:  68 y.o. Caucasian male with end stage renal disease on hemodialysis MWF, hypertension, CVA with left sided weakness, obstructive sleep apnea, hyperlipidemia, diabetes mellitus type II, congestive heart failure, pericarditis admitted to [facility] on 7/20 for left hip fracture. Status post left hip ORIF on 7/20 by Dr. Rosita Kea.   MWF [facility] left AVF  1.  End-stage renal disease Patient will receive his routine hemodialysis today Plan for discharge as per pace program Patient dialyzed in the bed today  2. Anemia of CKD - Hgb 9.7 - EPO with HD  3. Secondary hyperparathyroidism - calcitriol, sevelamer   LOS: 2 Yarieliz Wasser Thedore Mins 7/22/201911:41 AM  [facility] [place], [place] [phone]  Note: This note was prepared with Dragon dictation. Any transcription errors are unintentional

## 2018-04-10 NOTE — Evaluation (Signed)
Occupational Therapy Evaluation Patient Details Name: Nathan Freeman MRN: [id number] DOB: 1950/12/17 Today's Date: 04/10/2018    History of Present Illness Nathan Freeman is a 67yo male who comes to [facility] after a mechanical fall at [facility] and subsequent Left hip pain. Pt sustained a Lt hip fracture, is now s/p Lt hip ORIF with IM rod and WBAT. Pt was at [facility] for several weaks following ICH, but planning on returning to home in 2 weeks.    Clinical Impression   Pt seen for OT evaluation this date, POD#2 from above surgery. Pt is a PACE participant and was receiving assist with ADL and ambulating with a RW prior to this admission per chart review and pt report. Pt is eager to return to PLOF with less pain and improved safety and independence. Pt currently requires min-mod assist for UB dressing and mod-max assist for LB dressing and bathing while in seated position due to significant L hip pain and limited AROM of L hip. Pt instructed in bed mobility skills and positioning to improve sitting balance requiring varying min-mod assist and cues to support safe sitting balance. Pt ultimately limited by significant 10/10 L hip pain and unable to maintain sitting balance or attempt to perform transfer to the recliner. Max assist x2 for sit > supine. PT in room at end of session. Pt will benefit from skilled OT Services to address noted impairments in strength, pain, activity tolerance, cardiopulmonary status, and balance and functional deficits in order to maximize safety and return to PLOF. Recommend STR.    Follow Up Recommendations  SNF    Equipment Recommendations  Other (comment)(TBD)    Recommendations for Other Services       Precautions / Restrictions Precautions Precautions: Fall Restrictions Weight Bearing Restrictions: Yes LLE Weight Bearing: Weight bearing as tolerated      Mobility Bed Mobility Overal bed mobility: Needs Assistance Bed Mobility: Supine to Sit;Sit to Supine      Supine to sit: Mod assist;HOB elevated Sit to supine: Max assist;+2 for physical assistance   General bed mobility comments: pt limited by pain, despite max encouragement, pt ultimately required max assist x2 for sit to supine  Transfers                 General transfer comment: unable to attempt 2/2 pain    Balance Overall balance assessment: History of Falls;Needs assistance Sitting-balance support: Single extremity supported;Bilateral upper extremity supported;Feet supported;Feet unsupported Sitting balance-Leahy Scale: Poor Sitting balance - Comments: min-mod assist for sitting balance, pain limited                                   ADL either performed or assessed with clinical judgement   ADL Overall ADL's : Needs assistance/impaired Eating/Feeding: Sitting;Independent   Grooming: Sitting;Independent   Upper Body Bathing: Sitting;Moderate assistance;Minimal assistance   Lower Body Bathing: Sitting/lateral leans;Maximal assistance;Moderate assistance   Upper Body Dressing : Sitting;Minimal assistance;Moderate assistance   Lower Body Dressing: Sitting/lateral leans;Maximal assistance;Moderate assistance                       Vision Patient Visual Report: No change from baseline       Perception     Praxis      Pertinent Vitals/Pain Pain Assessment: 0-10 Pain Score: 10-Worst pain ever Pain Location: L hip with movement Pain Descriptors / Indicators: Aching;Sharp;Moaning;Grimacing;Guarding Pain Intervention(s): Limited activity within patient's  tolerance;Monitored during session;Premedicated before session;Repositioned     Hand Dominance Right   Extremity/Trunk Assessment Upper Extremity Assessment Upper Extremity Assessment: Overall WFL for tasks assessed(grossly WFL bilaterally, at least 4/5)   Lower Extremity Assessment Lower Extremity Assessment: Defer to PT evaluation       Communication Communication Communication:  No difficulties   Cognition Arousal/Alertness: Awake/alert Behavior During Therapy: WFL for tasks assessed/performed Overall Cognitive Status: Within Functional Limits for tasks assessed                                 General Comments: increased time to respond to commands but [facility]   General Comments       Exercises     Shoulder Instructions      Home Living Family/patient expects to be discharged to:: Skilled nursing facility                                        Prior Functioning/Environment Level of Independence: Needs assistance  Gait / Transfers Assistance Needed: Pt ambulates RW with PT, distances less than 16700ft.   Pt has had multiple falls in the past 6 months.  ADL's / Homemaking Assistance Needed: Pt has been requiring assist with ADL   Comments: Pt is a PACE participant.        OT Problem List: Decreased strength;Decreased knowledge of use of DME or AE;Decreased range of motion;Pain;Impaired balance (sitting and/or standing);Decreased activity tolerance;Cardiopulmonary status limiting activity      OT Treatment/Interventions: Self-care/ADL training;Balance training;Therapeutic exercise;Therapeutic activities;DME and/or AE instruction;Patient/family education    OT Goals(Current goals can be found in the care plan section) Acute Rehab OT Goals Patient Stated Goal: have less pain OT Goal Formulation: With patient Time For Goal Achievement: 04/24/18 Potential to Achieve Goals: Good ADL Goals Pt Will Perform Lower Body Dressing: with min assist;sitting/lateral leans Pt Will Transfer to Toilet: with mod assist;with min assist;bedside commode;stand pivot transfer  OT Frequency: Min 1X/week   Barriers to D/C:            Co-evaluation              AM-PAC PT "6 Clicks" Daily Activity     Outcome Measure Help from another person eating meals?: None Help from another person taking care of personal grooming?:  None Help from another person toileting, which includes using toliet, bedpan, or urinal?: A Lot Help from another person bathing (including washing, rinsing, drying)?: A Lot Help from another person to put on and taking off regular upper body clothing?: A Lot Help from another person to put on and taking off regular lower body clothing?: A Lot 6 Click Score: 16   End of Session    Activity Tolerance: Patient limited by pain Patient left: in bed;with call bell/phone within reach;Other (comment)(with PT in room for session)  OT Visit Diagnosis: Other abnormalities of gait and mobility (R26.89);Repeated falls (R29.6);Muscle weakness (generalized) (M62.81);Pain Pain - Right/Left: Left Pain - part of body: Hip                Time: [id number] OT Time Calculation (min): 20 min Charges:  OT General Charges $OT Visit: 1 Visit OT Evaluation $OT Eval Low Complexity: 1 Low OT Treatments $Self Care/Home Management : 8-22 mins   Richrd PrimeJamie Stiller, MPH, MS, OTR/L ascom [phone] 04/10/18, 9:21 AM

## 2018-04-10 NOTE — Progress Notes (Signed)
Physical Therapy Treatment Patient Details Name: Nathan Freeman MRN: [id number] DOB: [birth year] Today's Date: 04/10/2018    History of Present Illness  66yo male who comes to [facility] after a mechanical fall at [facility] and subsequent Left hip pain. Pt sustained a Lt hip fracture, is now s/p Lt hip ORIF with IM rod and WBAT 04/08/18. Pt was at [facility] for several weeks following ICH, but planning on returning to home in 2 weeks.     PT Comments    Patient agreeable to mobility at start of session, states hip pain is 10/10, nursing notified. Supine to sit transfer with maxAx1 with significant pain. Patient able to sit EOB with BUE support and CGA-minAx1 to maintain sitting balance. Sit to stand attempted maxAx2, RW and cam boot. Patient able to stand, but unable to take shuffling steps to chair. Stand pivot transfer attempted maxAx1, patient unable to keep feet underneath him transfer. Patient returned to supine max Ax2, all needs in reach at end of session.     Follow Up Recommendations  SNF;Supervision/Assistance - 24 hour     Equipment Recommendations  None recommended by PT    Recommendations for Other Services       Precautions / Restrictions Precautions Precautions: Fall Restrictions Weight Bearing Restrictions: Yes LLE Weight Bearing: Weight bearing as tolerated    Mobility  Bed Mobility Overal bed mobility: Needs Assistance Bed Mobility: Sit to Supine     Supine to sit: Max assist Sit to supine: Max assist;+2 for physical assistance   General bed mobility comments: Patient complains of sigificant LLE pain with movement  Transfers Overall transfer level: Needs assistance Equipment used: Rolling walker (2 wheeled) Transfers: Sit to/from [employer] Transfers   Stand pivot transfers: Max assist;From elevated surface;+2 physical assistance       General transfer comment: Patient able to stand with maxAx2, unable to take shuffling steps to chair. Stand pivot transfer  attempted maxAx2, unable to stand  Ambulation/Gait                 Stairs             Wheelchair Mobility    Modified Rankin (Stroke Patients Only)       Balance                                            Cognition                                              Exercises      General Comments        Pertinent Vitals/Pain Pain Assessment: 0-10 Pain Score: 10-Worst pain ever Pain Intervention(s): Limited activity within patient's tolerance;Monitored during session;Repositioned    Home Living                      Prior Function            PT Goals (current goals can now be found in the care plan section) Acute Rehab PT Goals Patient Stated Goal: have less pain PT Goal Formulation: With patient Time For Goal Achievement: 04/23/18 Potential to Achieve Goals: Good Progress towards PT goals: Progressing toward goals    Frequency    BID  PT Plan Current plan remains appropriate    Co-evaluation              AM-PAC PT "6 Clicks" Daily Activity  Outcome Measure  Difficulty turning over in bed (including adjusting bedclothes, sheets and blankets)?: Unable Difficulty moving from lying on back to sitting on the side of the bed? : Unable Difficulty sitting down on and standing up from a chair with arms (e.g., wheelchair, bedside commode, etc,.)?: Unable Help needed moving to and from a bed to chair (including a wheelchair)?: Total Help needed walking in hospital room?: Total Help needed climbing 3-5 steps with a railing? : Total 6 Click Score: 6    End of Session Equipment Utilized During Treatment: Gait belt Activity Tolerance: Patient limited by pain;Patient limited by lethargy;Patient limited by fatigue Patient left: in bed;with call bell/phone within reach;with SCD's reapplied;with bed alarm set Nurse Communication: Mobility status PT Visit Diagnosis: Difficulty in walking, not  elsewhere classified (R26.2);Other abnormalities of gait and mobility (R26.89);History of falling (Z91.81);Hemiplegia and hemiparesis Hemiplegia - Right/Left: Left Hemiplegia - dominant/non-dominant: Non-dominant Hemiplegia - caused by: Cerebral infarction     Time: [id number] PT Time Calculation (min) (ACUTE ONLY): 18 min  Charges:  $Therapeutic Activity: 8-22 mins                    G Codes:       Nathan Freeman PT, DPT 4:46 PM,04/10/18 [phone]

## 2018-04-10 NOTE — Progress Notes (Signed)
Post HD Assessment    04/10/18 1516  Neurological  Level of Consciousness Alert  Orientation Level Oriented to person;Oriented to place;Oriented to situation  Respiratory  Respiratory Pattern Regular  Chest Assessment Chest expansion symmetrical  Bilateral Breath Sounds Diminished  Cough None  Cardiac  Pulse Regular  Heart Sounds S1, S2  ECG Monitor Yes  Vascular  R Radial Pulse +2  L Radial Pulse +2  Edema Generalized  GU Assessment  Genitourinary (WDL) WDL

## 2018-04-10 NOTE — Progress Notes (Signed)
HD Tx started w/o complication   04/10/18 1033  Vital Signs  Temp 98.4 F (36.9 C)  Temp Source Oral  Pulse Rate 63  Pulse Rate Source Monitor  Resp 16  BP 123/74  BP Location Right Arm  BP Method Automatic  Patient Position (if appropriate) Lying  Oxygen Therapy  SpO2 100 %  O2 Device Nasal Cannula  O2 Flow Rate (L/min) 3 L/min  Pulse Oximetry Type Continuous  Pain Assessment  Pain Scale 0-10  Pain Score 8  Pain Type Chronic pain  Pain Location Buttocks  Pain Intervention(s) Repositioned  Dialysis Weight  Weight 81 kg (178 lb 9.2 oz)  Type of Weight Pre-Dialysis  Time-Out for Hemodialysis  What Procedure? hd  Pt Identifiers(min of two) First/Last Name;MRN/Account#  Correct Site? Yes  Correct Side? Yes  Correct Procedure? Yes  Consents Verified? Yes  Rad Studies Available? N/A  Safety Precautions Reviewed? Yes  Machine [employer]aleChecks  Machine Number [id number]  Station Number 4  UF/Alarm Test Passed  Conductivity: Meter 14  Conductivity: Machine  14  pH 7.4  Reverse Osmosis Main  Normal Saline Lot Number [id number]  Dialyzer Lot Number [id number]  Disposable Set Lot Number [id number]  Machine Temperature 98.6 F (37 C)  ImmunologistAir Detector Armed and Audible Yes  Blood Lines Intact and Secured Yes  Pre Treatment Patient Checks  Vascular access used during treatment Fistula  Hepatitis B Surface Antigen Results Negative  Date Hepatitis B Surface Antigen Drawn 11/10/17  Hepatitis B Surface Antibody 540  Date Hepatitis B Surface Antibody Drawn 11/10/17  Hemodialysis Standing Orders Initiated Yes  ECG (Telemetry) Monitor On Yes  Prime Ordered Normal Saline  Length of  DialysisTreatment -hour(s) 3.5 Hour(s)  Dialysis Treatment Comments Na 140  Dialyzer Elisio 17H NR  Dialysate 3K, 2.5 Ca  Dialysis Anticoagulant None  Dialysate Flow Ordered 600  Blood Flow Rate Ordered 400 mL/min  Ultrafiltration Goal 2 Liters  Dialysis Blood Pressure Support Ordered Normal Saline  During Hemodialysis  Assessment  Blood Flow Rate (mL/min) 400 mL/min  Arterial Pressure (mmHg) -170 mmHg  Venous Pressure (mmHg) 180 mmHg  Transmembrane Pressure (mmHg) 60 mmHg  Ultrafiltration Rate (mL/min) 710 mL/min  Dialysate Flow Rate (mL/min) 600 ml/min  Conductivity: Machine  14  HD Safety Checks Performed Yes  Dialysis Fluid Bolus Normal Saline  Bolus Amount (mL) 250 mL  Intra-Hemodialysis Comments Tx initiated  Education / Care Plan  Dialysis Education Provided Yes  Documented Education in Care Plan Yes

## 2018-04-10 NOTE — Progress Notes (Signed)
Post HD Tx, uf goal met, tolerated well.    04/10/18 1511  Hand-Off documentation  Report given to (Full Name) Raquel Sarna   Report received from (Full Name) Beatris Ship, RN   Vital Signs  Temp 98.4 F (36.9 C)  Pulse Rate 63  Pulse Rate Source Monitor  Resp 20  BP (!) 148/65  BP Location Right Arm  BP Method Automatic  Patient Position (if appropriate) Lying  Oxygen Therapy  SpO2 100 %  O2 Device Nasal Cannula  O2 Flow Rate (L/min) 3 L/min  Pulse Oximetry Type Continuous  Post-Hemodialysis Assessment  Rinseback Volume (mL) 250 mL  KECN 60.2 V  Dialyzer Clearance Lightly streaked  Duration of HD Treatment -hour(s) 3.5 hour(s)  Hemodialysis Intake (mL) 500 mL  UF Total -Machine (mL) 2509 mL  Net UF (mL) 2009 mL  Tolerated HD Treatment Yes  AVG/AVF Arterial Site Held (minutes) 8 minutes  AVG/AVF Venous Site Held (minutes) 10 minutes

## 2018-04-10 NOTE — Progress Notes (Signed)
HD Tx completed    04/10/18 1506  Vital Signs  Pulse Rate 63  Resp 20  BP (!) 141/69  Oxygen Therapy  SpO2 100 %  O2 Device Nasal Cannula  O2 Flow Rate (L/min) 3 L/min  Pulse Oximetry Type Continuous  During Hemodialysis Assessment  HD Safety Checks Performed Yes  KECN 60.2 KECN  Dialysis Fluid Bolus Normal Saline  Bolus Amount (mL) 250 mL  Intra-Hemodialysis Comments Tx completed;Tolerated well

## 2018-04-10 NOTE — Discharge Summary (Addendum)
[facility] [place] [facility]   PATIENT NAME: Nathan Freeman    MR#:  [id number]  DATE OF BIRTH:  1951-08-11  DATE OF ADMISSION:  04/08/2018 ADMITTING PHYSICIAN: Cammy Copa, MD  DATE OF DISCHARGE:  04/11/2018 PRIMARY CARE PHYSICIAN: [facility]    ADMISSION DIAGNOSIS:  Fall  DISCHARGE DIAGNOSIS:  Active Problems:   Hip fx (HCC)   SECONDARY DIAGNOSIS:   Past Medical History:  Diagnosis Date  . Acute pericarditis    Probable, Echo (7/10) showed mild LVH, normal LV size and systolic function, EF 55-60%, normal wall motion, mild MR, no AS, partially loculated small effusion along the RA free wall  . Anemia   . CHF (congestive heart failure) (HCC)   . DDD (degenerative disc disease), lumbar    Spine  . Depression   . Diabetes mellitus type II    Poor control due to medication noncompliance. Last hgbA1c 13.2  . Edema   . Hepatitis, unspecified   . Hyperlipidemia   . Hyperparathyroidism (HCC)   . Hypertension    Cough with a BP med in teh past, sounds like ACEI  . Kidney stones    dialysis t,t,s  . Malnutrition (HCC)   . Sleep apnea    cpap  . 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  1.  Acute comminuted intertrochanteric fracture left femur: Patient is postoperative day #3 intramedullary nail. DVT prophylaxis as per orthopedic surgery with aspirin 325 mg daily . Continue PT and PRN pain medications  2.  Left ankle pain: The left foot is negative for fracture.  There are visible heel spurs.  Orthopedic surgery has recommended CAM Walker boot for the left foot while up with therapy.  3.  Essential hypertension: Continue lisinopril, Coreg, Norvasc  4.  Depression: Continue Wellbutrin and Celexa  5.  Hyperlipidemia: Continue  Crestor  6.  End-stage renal disease on hemodialysis: He will continue dialysis Monday, Wednesday and Friday.   7.  Acute hypoxic respiratory failure in the setting of community acquired pneumonia: Patient was on Zosyn and will be discharged on oral Augmentin.  8.  Mild acute on chronic combined systolic and diastolic heart failure: Patient has no signs and symptoms of exacerbation at this time.    DISCHARGE CONDITIONS AND DIET:   Stable for discharge on diabetic heart healthy diet Renal diet CONSULTS OBTAINED:  Treatment Team:  Kennedy Bucker, MD Lamont Dowdy, MD  DRUG ALLERGIES:  No Known Allergies  DISCHARGE MEDICATIONS:   Allergies as of 04/11/2018   No Known Allergies     Medication List    STOP taking these medications   oxyCODONE-acetaminophen 5-325 MG tablet Commonly known as:  PERCOCET   predniSONE 10 MG (21) Tbpk tablet Commonly known as:  STERAPRED UNI-PAK 21 TAB   traMADol 50 MG tablet Commonly known as:  ULTRAM     TAKE these medications   acetaminophen 650 MG CR tablet Commonly known as:  TYLENOL Take 650 mg by mouth 2 (two) times daily.   albuterol (2.5 MG/3ML) 0.083% nebulizer solution Commonly known as:  PROVENTIL Take 2.5 mg by nebulization every 6 (six) hours as needed for wheezing or shortness of breath.   albuterol 108 (90 Base) MCG/ACT inhaler Commonly known as:  PROVENTIL HFA;VENTOLIN HFA Inhale 2 puffs  into the lungs every 4 (four) hours as needed for wheezing or shortness of breath.   amLODipine 10 MG tablet Commonly known as:  NORVASC Take 10 mg by mouth daily.   amoxicillin-clavulanate 500-125 MG tablet Commonly known as:  AUGMENTIN Take 1 tablet (500 mg total) by mouth daily for 6 days.   aspirin 325 MG EC tablet Take 1 tablet (325 mg total) by mouth daily with breakfast. What changed:    medication strength  how much to take  when to take this   buPROPion 150 MG 24 hr tablet Commonly known as:  WELLBUTRIN XL Take  150 mg by mouth daily.   calcitRIOL 0.25 MCG capsule Commonly known as:  ROCALTROL Take 0.25 mcg by mouth 3 (three) times a week. On dialysis days (MON, WED, FRI)   carvedilol 25 MG tablet Commonly known as:  COREG Take 25 mg by mouth 2 (two) times daily with a meal.   chlorpheniramine-HYDROcodone 10-8 MG/5ML Suer Commonly known as:  TUSSIONEX Take 5 mLs by mouth every 12 (twelve) hours as needed for cough.   citalopram 20 MG tablet Commonly known as:  CELEXA Take 20 mg by mouth daily.   docusate sodium 100 MG capsule Commonly known as:  COLACE Take 100 mg by mouth daily as needed for mild constipation.   fish oil-omega-3 fatty acids 1000 MG capsule Take 2 capsules by mouth daily with supper.   fluticasone 50 MCG/ACT nasal spray Commonly known as:  FLONASE Place 2 sprays into both nostrils daily as needed for allergies or rhinitis.   guaiFENesin 600 MG 12 hr tablet Commonly known as:  MUCINEX Take 600 mg by mouth 2 (two) times daily as needed for cough.   HYDROcodone-acetaminophen 5-325 MG tablet Commonly known as:  NORCO Take 1 tablet by mouth 2 (two) times daily.   lidocaine 5 % Commonly known as:  LIDODERM Place 2 patches onto the skin daily. 1 patch to left lower back and 1 patch to right lower back. Remove after 12 hours.   lisinopril 10 MG tablet Commonly known as:  PRINIVIL,ZESTRIL Take 10 mg by mouth daily.   liver oil-zinc oxide 40 % ointment Commonly known as:  DESITIN Apply 1 application topically 2 (two) times daily. And as needed with toileting for skin protection   ondansetron 4 MG disintegrating tablet Commonly known as:  ZOFRAN ODT Take 1 tablet (4 mg total) by mouth every 8 (eight) hours as needed for nausea or vomiting.   pantoprazole 40 MG tablet Commonly known as:  PROTONIX Take 40 mg by mouth daily.   polycarbophil 625 MG tablet Commonly known as:  FIBERCON Take 625 mg by mouth 2 (two) times daily.   ranitidine 150 MG tablet Commonly  known as:  ZANTAC Take 150 mg by mouth 2 (two) times daily.   rosuvastatin 10 MG tablet Commonly known as:  CRESTOR Take 10 mg by mouth daily.   senna-docusate 8.6-50 MG tablet Commonly known as:  Senokot-S Take 1 tablet by mouth at bedtime.   sevelamer carbonate 800 MG tablet Commonly known as:  RENVELA Take 800 mg by mouth 3 (three) times daily with meals.   tamsulosin 0.4 MG Caps capsule Commonly known as:  FLOMAX Take 0.4 mg by mouth daily.   thiamine 100 MG tablet Take by mouth.         Today   CHIEF COMPLAINT:  No acute issues overnight.  Ankle pain is better   VITAL SIGNS:  Blood pressure 139/69, pulse 66,  temperature 98.2 F (36.8 C), temperature source Oral, resp. rate 20, height 6' (1.829 m), weight 83.9 kg (185 lb), SpO2 98 %.   REVIEW OF SYSTEMS:  Review of Systems  Constitutional: Negative.  Negative for chills, fever and malaise/fatigue.  HENT: Negative.  Negative for ear discharge, ear pain, hearing loss, nosebleeds and sore throat.   Eyes: Negative.  Negative for blurred vision and pain.  Respiratory: Negative.  Negative for cough, hemoptysis, shortness of breath and wheezing.   Cardiovascular: Negative.  Negative for chest pain, palpitations and leg swelling.  Gastrointestinal: Negative.  Negative for abdominal pain, blood in stool, diarrhea, nausea and vomiting.  Genitourinary: Negative.  Negative for dysuria.  Musculoskeletal: Negative.  Negative for back pain.  Skin: Negative.   Neurological: Negative for dizziness, tremors, speech change, focal weakness, seizures and headaches.  Endo/Heme/Allergies: Negative.  Does not bruise/bleed easily.  Psychiatric/Behavioral: Negative.  Negative for depression, hallucinations and suicidal ideas.     PHYSICAL EXAMINATION:  GENERAL:  67 y.o.-year-old patient lying in the bed with no acute distress.  NECK:  Supple, no jugular venous distention. No thyroid enlargement, no tenderness.  LUNGS: Normal  breath sounds bilaterally, no wheezing, rales,rhonchi  No use of accessory muscles of respiration.  CARDIOVASCULAR: S1, S2 normal. No murmurs, rubs, or gallops.  ABDOMEN: Soft, non-tender, non-distended. Bowel sounds present. No organomegaly or mass.  EXTREMITIES: No pedal edema, cyanosis, or clubbing.  PSYCHIATRIC: The patient is alert and oriented x 3.  SKIN: No obvious rash, lesion, or ulcer.   DATA REVIEW:   CBC Recent Labs  Lab 04/11/18 0409  WBC 6.5  HGB 9.6*  HCT 29.0*  PLT 145*    Chemistries  Recent Labs  Lab 04/08/18 0050  04/11/18 0409  NA 138   < > 137  K 4.0   < > 4.5  CL 99   < > 99  CO2 28   < > 29  GLUCOSE 152*   < > 113*  BUN 25*   < > 30*  CREATININE 2.60*   < > 3.12*  CALCIUM 9.0   < > 8.9  AST 25  --   --   ALT 19  --   --   ALKPHOS 95  --   --   BILITOT 1.0  --   --    < > = values in this interval not displayed.    Cardiac Enzymes No results for input(s): TROPONINI in the last 168 hours.  Microbiology Results  @MICRORSLT48 @  RADIOLOGY:  No results found.    Allergies as of 04/11/2018   No Known Allergies     Medication List    STOP taking these medications   oxyCODONE-acetaminophen 5-325 MG tablet Commonly known as:  PERCOCET   predniSONE 10 MG (21) Tbpk tablet Commonly known as:  STERAPRED UNI-PAK 21 TAB   traMADol 50 MG tablet Commonly known as:  ULTRAM     TAKE these medications   acetaminophen 650 MG CR tablet Commonly known as:  TYLENOL Take 650 mg by mouth 2 (two) times daily.   albuterol (2.5 MG/3ML) 0.083% nebulizer solution Commonly known as:  PROVENTIL Take 2.5 mg by nebulization every 6 (six) hours as needed for wheezing or shortness of breath.   albuterol 108 (90 Base) MCG/ACT inhaler Commonly known as:  PROVENTIL HFA;VENTOLIN HFA Inhale 2 puffs into the lungs every 4 (four) hours as needed for wheezing or shortness of breath.   amLODipine 10 MG tablet Commonly known as:  NORVASC Take 10 mg by mouth  daily.   amoxicillin-clavulanate 500-125 MG tablet Commonly known as:  AUGMENTIN Take 1 tablet (500 mg total) by mouth daily for 6 days.   aspirin 325 MG EC tablet Take 1 tablet (325 mg total) by mouth daily with breakfast. What changed:    medication strength  how much to take  when to take this   buPROPion 150 MG 24 hr tablet Commonly known as:  WELLBUTRIN XL Take 150 mg by mouth daily.   calcitRIOL 0.25 MCG capsule Commonly known as:  ROCALTROL Take 0.25 mcg by mouth 3 (three) times a week. On dialysis days (MON, WED, FRI)   carvedilol 25 MG tablet Commonly known as:  COREG Take 25 mg by mouth 2 (two) times daily with a meal.   chlorpheniramine-HYDROcodone 10-8 MG/5ML Suer Commonly known as:  TUSSIONEX Take 5 mLs by mouth every 12 (twelve) hours as needed for cough.   citalopram 20 MG tablet Commonly known as:  CELEXA Take 20 mg by mouth daily.   docusate sodium 100 MG capsule Commonly known as:  COLACE Take 100 mg by mouth daily as needed for mild constipation.   fish oil-omega-3 fatty acids 1000 MG capsule Take 2 capsules by mouth daily with supper.   fluticasone 50 MCG/ACT nasal spray Commonly known as:  FLONASE Place 2 sprays into both nostrils daily as needed for allergies or rhinitis.   guaiFENesin 600 MG 12 hr tablet Commonly known as:  MUCINEX Take 600 mg by mouth 2 (two) times daily as needed for cough.   HYDROcodone-acetaminophen 5-325 MG tablet Commonly known as:  NORCO Take 1 tablet by mouth 2 (two) times daily.   lidocaine 5 % Commonly known as:  LIDODERM Place 2 patches onto the skin daily. 1 patch to left lower back and 1 patch to right lower back. Remove after 12 hours.   lisinopril 10 MG tablet Commonly known as:  PRINIVIL,ZESTRIL Take 10 mg by mouth daily.   liver oil-zinc oxide 40 % ointment Commonly known as:  DESITIN Apply 1 application topically 2 (two) times daily. And as needed with toileting for skin protection    ondansetron 4 MG disintegrating tablet Commonly known as:  ZOFRAN ODT Take 1 tablet (4 mg total) by mouth every 8 (eight) hours as needed for nausea or vomiting.   pantoprazole 40 MG tablet Commonly known as:  PROTONIX Take 40 mg by mouth daily.   polycarbophil 625 MG tablet Commonly known as:  FIBERCON Take 625 mg by mouth 2 (two) times daily.   ranitidine 150 MG tablet Commonly known as:  ZANTAC Take 150 mg by mouth 2 (two) times daily.   rosuvastatin 10 MG tablet Commonly known as:  CRESTOR Take 10 mg by mouth daily.   senna-docusate 8.6-50 MG tablet Commonly known as:  Senokot-S Take 1 tablet by mouth at bedtime.   sevelamer carbonate 800 MG tablet Commonly known as:  RENVELA Take 800 mg by mouth 3 (three) times daily with meals.   tamsulosin 0.4 MG Caps capsule Commonly known as:  FLOMAX Take 0.4 mg by mouth daily.   thiamine 100 MG tablet Take by mouth.           Management plans discussed with the patient and he is in agreement. Stable for discharge   Patient should follow up with ortho  CODE STATUS:     Code Status Orders  (From admission, onward)        Start  Ordered   04/09/18 0935  Limited resuscitation (code)  Continuous    Question Answer Comment  In the event of cardiac or respiratory ARREST: Initiate Code Blue, Call Rapid Response Yes   In the event of cardiac or respiratory ARREST: Perform CPR Yes   In the event of cardiac or respiratory ARREST: Perform Intubation/Mechanical Ventilation No   In the event of cardiac or respiratory ARREST: Use NIPPV/BiPAp only if indicated Yes   In the event of cardiac or respiratory ARREST: Administer ACLS medications if indicated Yes   In the event of cardiac or respiratory ARREST: Perform Defibrillation or Cardioversion if indicated Yes      04/09/18 0935    Code Status History    Date Active Date Inactive Code Status Order ID Comments User Context   04/08/2018 1241 04/09/2018 0935 Full Code  [id number]  Wilfred Lacy, RN Inpatient   04/08/2018 1120 04/08/2018 1241 DNR [id number]  Adrian Saran, MD Inpatient   04/08/2018 0303 04/08/2018 1120 Full Code [id number]  Cammy Copa, MD Inpatient   01/07/2018 0006 01/08/2018 2042 Full Code [id number]  Oralia Manis, MD ED    Advance Directive Documentation     Most Recent Value  Type of Advance Directive  Living will, Healthcare Power of Attorney  Pre-existing out of facility DNR order (yellow form or pink MOST form)  -  "MOST" Form in Place?  -      TOTAL TIME TAKING CARE OF THIS PATIENT: 38 minutes.    Note: This dictation was prepared with Dragon dictation along with smaller phrase technology. Any transcriptional errors that result from this process are unintentional.  Khushi Zupko M.D on 04/11/2018 at 10:27 AM  Between 7am to 6pm - Pager - [phone] After 6pm go to [email or web address] - Social research officer, government  [facility]  Office  [phone]  CC: Primary care physician; [facility] [employer]

## 2018-04-10 NOTE — Progress Notes (Signed)
Discharge was cancelled for today because patient did not sit up for dialysis. MD and RN aware of above. [facility] is aware of above. Nicole with [facility] is aware of above. Patient's daughter Aggie CosierCrystal is aware of above.   Baker Hughes IncorporatedBailey Monifa Blanchette, LCSW [phone]

## 2018-04-10 NOTE — Progress Notes (Signed)
Physical Therapy Treatment Patient Details Name: Catarina HartshornGerry W Duman MRN: [id number] DOB: 08/01/1951 Today's Date: 04/10/2018    History of Present Illness  66yo [facility] Left [facility] pain. Pt sustained a Lt hip fracture, is now s/p Lt hip ORIF with IM rod and WBAT 04/08/18. [facility], but planning on returning to home in 2 weeks.     PT Comments    Patient with OT at start of session, sit to supine max Ax2 to return patient to bed, patient with significant L hip pain, 10/10 with movement. Patient able to perform bed level exercises with AAROM for LLE, patient fatigued/lethargic throughout, needed multimodal cues to attend to task and participate with exercises. Further mobility deferred due to fatigue/pain levels this session. Patient in bed with all needs in reach at end of session, pain 1/10.      Follow Up Recommendations  SNF;Supervision/Assistance - 24 hour     Equipment Recommendations  None recommended by PT    Recommendations for Other Services       Precautions / Restrictions Precautions Precautions: Fall Restrictions Weight Bearing Restrictions: Yes LLE Weight Bearing: Weight bearing as tolerated    Mobility  Bed Mobility Overal bed mobility: Needs Assistance Bed Mobility: Sit to Supine     Supine to sit: Mod assist;HOB elevated Sit to supine: Max assist;+2 for physical assistance   General bed mobility comments: Patient complains of sigificant LLE pain with movement  Transfers                 General transfer comment: unable to attempt 2/2 pain  Ambulation/Gait                 Stairs             Wheelchair Mobility    Modified Rankin (Stroke Patients Only)       Balance                                  Cognition Arousal/Alertness: Lethargic Behavior During Therapy: WFL for tasks assessed/performed Overall Cognitive Status:  Within Functional Limits for tasks assessed                                      Exercises General Exercises - Lower Extremity Ankle Circles/Pumps: AROM;Right;20 reps;AAROM;Left Short Arc Quad: AAROM;Left;10 reps Heel Slides: AAROM;Left;20 reps;AROM;Right;10 reps Hip ABduction/ADduction: AAROM;Left;20 reps;AROM;Right;10 reps Straight Leg Raises: AAROM;Left;15 reps    General Comments        Pertinent Vitals/Pain Pain Assessment: 0-10 Pain Score: 1  Pain Location: L hip while resting at end of session Pain Descriptors / Indicators: Aching;Sharp;Moaning;Grimacing;Guarding Pain Intervention(s): Limited activity within patient's tolerance;Repositioned;Monitored during session;Ice applied    Home Living Family/patient expects to be discharged to:: Skilled nursing facility                    Prior Function Level of Independence: Needs assistance  Gait / Transfers Assistance Needed: Pt ambulates RW with PT, distances less than 14800ft.   Pt has had multiple falls in the past 6 months.  ADL's / Homemaking Assistance Needed: Pt has been requiring assist with ADL Comments: Pt is a [facility] participant.   PT Goals (current goals can now be found  in the care plan section) Acute Rehab PT Goals Patient Stated Goal: have less pain Progress towards PT goals: Not progressing toward goals - comment(Patient with significant LLE pain this morning, limited mobility)    Frequency    BID      PT Plan Current plan remains appropriate    Co-evaluation              AM-PAC PT "6 Clicks" Daily Activity  Outcome Measure  Difficulty turning over in bed (including adjusting bedclothes, sheets and blankets)?: Unable Difficulty moving from lying on back to sitting on the side of the bed? : Unable Difficulty sitting down on and standing up from a chair with arms (e.g., wheelchair, bedside commode, etc,.)?: Unable Help needed moving to and from a bed to chair (including a  wheelchair)?: Total Help needed walking in hospital room?: Total Help needed climbing 3-5 steps with a railing? : Total 6 Click Score: 6    End of Session   Activity Tolerance: Patient limited by pain;Patient limited by lethargy;Patient limited by fatigue Patient left: in bed;with call bell/phone within reach;with SCD's reapplied;with bed alarm set Nurse Communication: Mobility status PT Visit Diagnosis: Difficulty in walking, not elsewhere classified (R26.2);Other abnormalities of gait and mobility (R26.89);History of falling (Z91.81);Hemiplegia and hemiparesis Hemiplegia - Right/Left: Left Hemiplegia - dominant/non-dominant: Non-dominant Hemiplegia - caused by: Cerebral infarction     Time: 0902-0925 PT Time Calculation (min) (ACUTE ONLY): 23 min  Charges:  $Therapeutic Exercise: 8-22 mins $Therapeutic Activity: 8-22 mins                    G Codes:       Olga Coaster PT, DPT 9:42 AM,04/10/18 [phone]

## 2018-04-11 LAB — GLUCOSE, CAPILLARY
GLUCOSE-CAPILLARY: 187 mg/dL — AB (ref 70–99)
Glucose-Capillary: 120 mg/dL — ABNORMAL HIGH (ref 70–99)
Glucose-Capillary: 178 mg/dL — ABNORMAL HIGH (ref 70–99)

## 2018-04-11 LAB — BASIC METABOLIC PANEL
Anion gap: 9 (ref 5–15)
BUN: 30 mg/dL — ABNORMAL HIGH (ref 8–23)
CHLORIDE: 99 mmol/L (ref 98–111)
CO2: 29 mmol/L (ref 22–32)
Calcium: 8.9 mg/dL (ref 8.9–10.3)
Creatinine, Ser: 3.12 mg/dL — ABNORMAL HIGH (ref 0.61–1.24)
GFR calc Af Amer: 23 mL/min — ABNORMAL LOW (ref >60)
GFR, EST NON AFRICAN AMERICAN: 19 mL/min — AB (ref >60)
GLUCOSE: 113 mg/dL — AB (ref 70–99)
POTASSIUM: 4.5 mmol/L (ref 3.5–5.1)
Sodium: 137 mmol/L (ref 135–145)

## 2018-04-11 LAB — CBC
HEMATOCRIT: 29 % — AB (ref 40.0–52.0)
HEMOGLOBIN: 9.6 g/dL — AB (ref 13.0–18.0)
MCH: 32.5 pg (ref 26.0–34.0)
MCHC: 33.2 g/dL (ref 32.0–36.0)
MCV: 97.9 fL (ref 80.0–100.0)
PLATELETS: 145 10*3/uL — AB (ref 150–440)
RBC: 2.96 MIL/uL — AB (ref 4.40–5.90)
RDW: 15.4 % — ABNORMAL HIGH (ref 11.5–14.5)
WBC: 6.5 10*3/uL (ref 3.8–10.6)

## 2018-04-11 MED ORDER — AMOXICILLIN-POT CLAVULANATE 500-125 MG PO TABS: 1.0000 | ORAL_TABLET | Freq: Three times a day (TID) | ORAL | 0 refills | Status: DC

## 2018-04-11 MED ORDER — AMOXICILLIN-POT CLAVULANATE 500-125 MG PO TABS: 1.0000 | ORAL_TABLET | Freq: Every day | ORAL | 0 refills | Status: AC

## 2018-04-11 MED ORDER — FLEET ENEMA 7-19 GM/118ML RE ENEM
1.0000 | ENEMA | Freq: Every day | RECTAL | Status: DC | PRN
  Administered 2018-04-11: 1 via RECTAL
  Filled 2018-04-11: qty 1

## 2018-04-11 MED ORDER — NEPRO/CARBSTEADY PO LIQD
237.0000 mL | Freq: Two times a day (BID) | ORAL | Status: DC
  Administered 2018-04-11 (×2): 237 mL via ORAL

## 2018-04-11 NOTE — Progress Notes (Signed)
[facility]. [facility] [place], [place] 04/11/18  Subjective:   Doing fair Sitting in the chair today Appetite is low but no nausea or vomiting   Objective:  Vital signs in last 24 hours:  Temp:  [98.2 F (36.8 C)-99 F (37.2 C)] 98.2 F (36.8 C) (07/23 0821) Pulse Rate:  [62-74] 66 (07/23 0821) Resp:  [16-20] 20 (07/23 0821) BP: (126-148)/(59-69) 139/69 (07/23 0821) SpO2:  [93 %-100 %] 98 % (07/23 0821) Weight:  [83.9 kg (185 lb)] 83.9 kg (185 lb) (07/23 0410)  Weight change: 0.26 kg (9.2 oz) Filed Weights   04/10/18 0500 04/10/18 1033 04/11/18 0410  Weight: 80.7 kg (178 lb) 81 kg (178 lb 9.2 oz) 83.9 kg (185 lb)    Intake/Output:    Intake/Output Summary (Last 24 hours) at 04/11/2018 1157 Last data filed at 04/11/2018 1031 Gross per 24 hour  Intake 821.25 ml  Output 2009 ml  Net -1187.75 ml     Physical Exam: General:  no acute distress  HEENT  anicteric, moist oral mucous membranes  Neck  supple  Pulm/lungs  clear to auscultation bilaterally  CVS/Heart  regular rhythm  Abdomen:   Soft, nontender  Extremities:  Left hip bandage  Neurologic:  Alert oriented  Skin:  No acute rashes  Access:  Left arm AV fistula       Basic Metabolic Panel:  Recent Labs  Lab 04/08/18 0050 04/08/18 0517 04/09/18 0448 04/11/18 0409  NA 138 136 137 137  K 4.0 4.9 4.6 4.5  CL 99 99 99 99  CO2 28 26 27 29   GLUCOSE 152* 152* 133* 113*  BUN 25* 26* 39* 30*  CREATININE 2.60* 2.77* 3.64* 3.12*  CALCIUM 9.0 9.0 9.2 8.9     CBC: Recent Labs  Lab 04/08/18 0050 04/08/18 0517 04/09/18 0448 04/10/18 0456 04/11/18 0409  WBC 5.6 13.8* 8.2 8.2 6.5  NEUTROABS 4.5  --   --   --   --   HGB 11.0* 11.3* 10.2* 9.7* 9.6*  HCT 32.6* 34.3* 30.4* 29.0* 29.0*  MCV 96.3 99.0 98.3 97.7 97.9  PLT 198 222 162 148* 145*     No results found for: HEPBSAG, HEPBSAB, HEPBIGM    Microbiology:  Recent Results (from the past 240 hour(s))  Surgical pcr screen     Status:  Abnormal   Collection Time: 04/08/18  3:16 AM  Result Value Ref Range Status   MRSA, PCR NEGATIVE NEGATIVE Final   Staphylococcus aureus POSITIVE (A) NEGATIVE Final    Comment: (NOTE) The Xpert SA Assay (FDA approved for NASAL specimens in patients 66 years of age and older), is one component of a comprehensive surveillance program. It is not intended to diagnose infection nor to guide or monitor treatment. Performed at [facility], [address] [place], [place] [address]     Coagulation Studies: No results for input(s): LABPROT, INR in the last 72 hours.  Urinalysis: No results for input(s): COLORURINE, LABSPEC, PHURINE, GLUCOSEU, HGBUR, BILIRUBINUR, KETONESUR, PROTEINUR, UROBILINOGEN, NITRITE, LEUKOCYTESUR in the last 72 hours.  Invalid input(s): APPERANCEUR    Imaging: No results found.   Medications:   . methocarbamol (ROBAXIN)  IV    . piperacillin-tazobactam (ZOSYN)  IV 3.375 g (04/11/18 1105)   . amLODipine  10 mg Oral Daily  . aspirin EC  325 mg Oral Q breakfast  . buPROPion  150 mg Oral Daily  . calcitRIOL  0.25 mcg Oral Once per day on Mon Wed Fri  . carvedilol  25 mg Oral BID WC  . Chlorhexidine Gluconate Cloth  6 each Topical Q0600  . citalopram  20 mg Oral Daily  . docusate sodium  100 mg Oral BID  . feeding supplement (NEPRO CARB STEADY)  237 mL Oral BID BM  . furosemide  40 mg Intravenous Q12H  . insulin aspart  0-9 Units Subcutaneous TID WC  . lisinopril  10 mg Oral Daily  . omega-3 acid ethyl esters  2 capsule Oral Q supper  . pantoprazole  40 mg Oral Daily  . rosuvastatin  10 mg Oral Daily  . senna-docusate  1 tablet Oral QHS  . sevelamer carbonate  800 mg Oral TID WC  . tamsulosin  0.4 mg Oral Daily  . thiamine  100 mg Oral Daily   [DISCONTINUED] acetaminophen **OR** acetaminophen, acetaminophen, albuterol, alum & mag hydroxide-simeth, bisacodyl, bisacodyl, fluticasone, HYDROcodone-acetaminophen, HYDROmorphone (DILAUDID) injection,  magnesium citrate, magnesium hydroxide, menthol-cetylpyridinium **OR** phenol, methocarbamol **OR** methocarbamol (ROBAXIN)  IV, metoCLOPramide **OR** metoCLOPramide (REGLAN) injection, morphine injection, ondansetron **OR** ondansetron (ZOFRAN) IV, traMADol, traZODone  Assessment/ Plan:  67 y.o. Caucasian male with end stage renal disease on hemodialysis MWF, hypertension, CVA with left sided weakness, obstructive sleep apnea, hyperlipidemia, diabetes mellitus type II, congestive heart failure, pericarditis admitted to [facility] on 7/20 for left hip fracture. Status post left hip ORIF on 7/20 by Dr. Rosita Kea.   MWF [facility] left AVF  1.  End-stage renal disease Patient will receive his routine hemodialysis tomorrow  Plan for discharge as per pace program to [facility] Plan for HD in chair tomorrow if still in hospital  2. Anemia of CKD - Hgb 9.6 - EPO with HD  3. Secondary hyperparathyroidism - calcitriol, sevelamer   LOS: 3 Nathan Freeman Thedore Mins 7/23/201911:57 AM  [facility] [place], [place] [phone]  Note: This note was prepared with Dragon dictation. Any transcription errors are unintentional

## 2018-04-11 NOTE — Progress Notes (Signed)
Per RN patient has not had a BM yet so [facility] requested EMS to transport because they will not be able to transport this late in the afternoon. RN aware of above. [facility] is aware of above.   [employer]Bailey Zaccheaus Storlie, LCSW [phone]

## 2018-04-11 NOTE — Progress Notes (Addendum)
Patient is to be discharged to [facility] today. Patient is in no acute distress at this time, and assessment is unchanged from this morning. Pt's IV remains as he is waiting to be discharged after he has a BM. He has received po bisacodyl, Po mag citrate, and RE bisacodyl. [facility] at [facility] (Kori) has been given report and is aware that once he has a BM, nonemergent EMS transport needs to be called (per social worker Fredric MareBailey) and pt's IV is to be removed.   Staff at [facility] have been called and Pam took report. No questions or concerns were raised during report. Pt's daughter Aggie CosierCrystal was also notified of pt discharge per pt request. Pt complains of some decreased sensation in his L leg, but he tells me that this has been an issue prior to his surgery this admission. When I spoke to Crystal I asked about this and she stated that his stroke in the past did affect his L side and this may be why he has decreased sensation. She stated that she would notify the [facility] PA so that this could be addressed if needed by them upon discharge. I also paged Dr. Juliene PinaMody about this and did not receive any new orders. We did discuss that despite all medications administered thus far for a BM that the pt has not yet had a BM. She stated to order PRN soap suds and PRN fleet enema. Orders added to epic.

## 2018-04-11 NOTE — Progress Notes (Signed)
Pt returned back to bed. He was up to chair from 0930 to 1245.

## 2018-04-11 NOTE — Progress Notes (Signed)
Patient is medically stable for D/C today. PT was able to get patient in the chair. Per Cat with [facility] she will arrange for [facility] to transport around 3 pm. Clinical Social Worker (CSW) sent D/C orders to [facility] via [employer]. Per [facility] admissions coordinator at [facility] patient can come today to room 219-B. RN will call report at [phone]. Patient is aware of above. Per patient he has a cpap at home but he doesn't use it because it "choked" him one time. CSW contacted patient's daughter Aggie CosierCrystal and made her aware of above. Please reconsult if future social work needs arise. CSW signing off.   [employer]Bailey Jarom Govan, LCSW [phone]

## 2018-04-11 NOTE — Progress Notes (Signed)
Subjective: 3 Days Post-Op Procedure(s) (LRB): INTRAMEDULLARY (IM) NAIL FEMORAL (Left) Patient reports pain as mild to the left hip but reports moderate pain in the left heel. X-rays of the left foot negative for acute fracture, does show heel spurs.  Boot applied yesterday, patient noticed improvement with heel pain with ambulation. Patient is complaining of right sided chest pain, sharp with taking a deep breath.  Denies any shortness of breath, coughing.  Pain began this morning, comes and goes lasts. Fever: no Gastrointestinal:Negative for nausea and vomiting  Objective: Vital signs in last 24 hours: Temp:  [98.2 F (36.8 C)-99 F (37.2 C)] 98.2 F (36.8 C) (07/23 0821) Pulse Rate:  [61-74] 66 (07/23 0821) Resp:  [16-20] 20 (07/23 0821) BP: (109-148)/(51-74) 139/69 (07/23 0821) SpO2:  [93 %-100 %] 98 % (07/23 0821) Weight:  [81 kg (178 lb 9.2 oz)-83.9 kg (185 lb)] 83.9 kg (185 lb) (07/23 0410)  Intake/Output from previous day:  Intake/Output Summary (Last 24 hours) at 04/11/2018 1011 Last data filed at 04/11/2018 0300 Gross per 24 hour  Intake 941.25 ml  Output 2009 ml  Net -1067.75 ml    Intake/Output this shift: No intake/output data recorded.  Labs: Recent Labs    04/09/18 0448 04/10/18 0456 04/11/18 0409  HGB 10.2* 9.7* 9.6*   Recent Labs    04/10/18 0456 04/11/18 0409  WBC 8.2 6.5  RBC 2.97* 2.96*  HCT 29.0* 29.0*  PLT 148* 145*   Recent Labs    04/09/18 0448 04/11/18 0409  NA 137 137  K 4.6 4.5  CL 99 99  CO2 27 29  BUN 39* 30*  CREATININE 3.64* 3.12*  GLUCOSE 133* 113*  CALCIUM 9.2 8.9   No results for input(s): LABPT, INR in the last 72 hours.   EXAM General - Patient is Alert, Appropriate and Oriented  Chest-patient with right-sided chest tenderness along the anterior axillary line, just lateral to the right nipple.  No bruising noted.  No step-off noted.  No respiratory distress no coughing Extremity - ABD soft Sensation intact  distally Intact pulses distally Dorsiflexion/Plantar flexion intact Incision: dressing C/D/I No cellulitis present Dressing/Incision - clean, dry, no drainage Motor Function - intact, moving foot and toes well on exam.  Tolerating left boot well.  Past Medical History:  Diagnosis Date  . Acute pericarditis    Probable, Echo (7/10) showed mild LVH, normal LV size and systolic function, EF 55-60%, normal wall motion, mild MR, no AS, partially loculated small effusion along the RA free wall  . Anemia   . CHF (congestive heart failure) (HCC)   . DDD (degenerative disc disease), lumbar    Spine  . Depression   . Diabetes mellitus type II    Poor control due to medication noncompliance. Last hgbA1c 13.2  . Edema   . Hepatitis, unspecified   . Hyperlipidemia   . Hyperparathyroidism (HCC)   . Hypertension    Cough with a BP med in teh past, sounds like ACEI  . Kidney stones    dialysis t,t,s  . Malnutrition (HCC)   . Sleep apnea    cpap  . Stroke (HCC)    02/2013 left side weakness    Assessment/Plan: 3 Days Post-Op Procedure(s) (LRB): INTRAMEDULLARY (IM) NAIL FEMORAL (Left) Active Problems:   Hip fx (HCC)  Estimated body mass index is 25.09 kg/m as calculated from the following:   Height as of this encounter: 6' (1.829 m).   Weight as of this encounter: 83.9 kg (185 lb).  Advance diet Up with therapy D/C IV fluids when tolerating po intake.  Labs reviewed this AM, hemoglobin stable.  Hg 9.6 this AM.  X-ray of the left foot negative for fracture, Heel spurs visible.  Seeing improvement with CAM Walker boot.    Up with therapy today, PT currently recommending SNF.  Sharp right-sided chest pain consistent with chest wall pain.  Patient is tender to touch and pain increased only with taking a deep breath.  Vital signs are stable.  Follow-up with [facility] in 2 weeks for staple removal  DVT Prophylaxis - Aspirin, Foot Pumps and TED hose Weight-Bearing as  tolerated to left leg  Evon Slack, PA-C [facility] 04/11/2018, 10:11 AM

## 2018-04-11 NOTE — Progress Notes (Signed)
Physical Therapy Treatment Patient Details Name: Nathan HartshornGerry W Boan MRN: [id number] DOB: 11/05/1950 Today's Date: 04/11/2018    History of Present Illness  67yo male who [facility] and subsequent Left hip pain. Pt sustained a Lt hip fracture, is now s/p Lt hip ORIF with IM rod and WBAT 04/08/18. [facility] following ICH, but planning on returning to home in 2 weeks.     PT Comments    Pt in bed, ready for session.  Participated in exercises as described below. CAM boot applied to LLE.  To edge of bed with max a x 2.  Resists at times due to pain.  Once sitting, continued to need min/mod a x 1 at times to maintain sitting.  Pain and contracture LLE remain barriers but he was able to transfer to drop arm recliner at bedside with squat pivot transfer with max/dependant x 2.  Positioned in recliner for comfort.    Pt needs to be out of bed 3 hours before discharge per notes.  He was up at 9:30 this am.  Discussed with RN and nurse tech regarding time and transfer status.     Follow Up Recommendations  SNF     Equipment Recommendations  None recommended by PT    Recommendations for Other Services       Precautions / Restrictions Precautions Precautions: Fall Restrictions Weight Bearing Restrictions: Yes LLE Weight Bearing: Weight bearing as tolerated    Mobility  Bed Mobility Overal bed mobility: Needs Assistance Bed Mobility: Supine to Sit     Supine to sit: Max assist;+2 for physical assistance        Transfers Overall transfer level: Needs assistance Equipment used: None Transfers: Squat Pivot Transfers   Stand pivot transfers: Max assist;Total assist;+2 physical assistance       General transfer comment: squat pivot transfer to drop arm recliner.  Unable to stand fully  Ambulation/Gait             General Gait Details: unable   Stairs             Wheelchair Mobility    Modified Rankin (Stroke  Patients Only)       Balance Overall balance assessment: History of Falls;Needs assistance Sitting-balance support: Single extremity supported;Bilateral upper extremity supported;Feet supported;Feet unsupported Sitting balance-Leahy Scale: Poor Sitting balance - Comments: min-mod assist for sitting balance, pain limited   Standing balance support: Bilateral upper extremity supported Standing balance-Leahy Scale: Zero                              Cognition Arousal/Alertness: Awake/alert Behavior During Therapy: WFL for tasks assessed/performed Overall Cognitive Status: Within Functional Limits for tasks assessed                                        Exercises Other Exercises Other Exercises: RLE AROM x 10 for ankle pumps, heel slides, ab/add and SLR Other Exercises: LLE SLR AA/PROM limited by pain    General Comments        Pertinent Vitals/Pain Pain Assessment: Faces Faces Pain Scale: Hurts whole lot Pain Location: During mobility Pain Descriptors / Indicators: Aching;Sharp;Moaning;Grimacing;Guarding Pain Intervention(s): Limited activity within patient's tolerance;Monitored during session    Home Living  Prior Function            PT Goals (current goals can now be found in the care plan section) Progress towards PT goals: Progressing toward goals    Frequency    BID      PT Plan Current plan remains appropriate    Co-evaluation              AM-PAC PT "6 Clicks" Daily Activity  Outcome Measure  Difficulty turning over in bed (including adjusting bedclothes, sheets and blankets)?: Unable Difficulty moving from lying on back to sitting on the side of the bed? : Unable Difficulty sitting down on and standing up from a chair with arms (e.g., wheelchair, bedside commode, etc,.)?: Unable Help needed moving to and from a bed to chair (including a wheelchair)?: Total Help needed walking in  hospital room?: Total Help needed climbing 3-5 steps with a railing? : Total 6 Click Score: 6    End of Session Equipment Utilized During Treatment: Gait belt Activity Tolerance: Patient limited by pain Patient left: in chair;with chair alarm set;with call bell/phone within reach Nurse Communication: Mobility status Hemiplegia - Right/Left: Left Hemiplegia - dominant/non-dominant: Non-dominant Hemiplegia - caused by: Cerebral infarction     Time: 1610-9604 PT Time Calculation (min) (ACUTE ONLY): 20 min  Charges:  $Therapeutic Activity: 8-22 mins                    G Codes:       Danielle Dess, PTA 04/11/18, 10:52 AM

## 2018-04-11 NOTE — Progress Notes (Signed)
Pharmacy Antibiotic Note  Nathan Freeman is a 67 y.o. male admitted on 04/08/2018 with pneumonia.  Pharmacy has been consulted for Zosyn dosing. He has end stage renal disease on hemodialysis MWF, hypertension, CVA with left sided weakness, obstructive sleep apnea, hyperlipidemia, diabetes mellitus type II, congestive heart failure, pericarditis [facility] for left hip fracture. Status post surgery on 7/20 by Dr. Rosita KeaMenz  Plan: Zosyn 3.375g IV q12h (4 hour infusion).  Height: 6' (182.9 cm) Weight: 185 lb (83.9 kg) IBW/kg (Calculated) : 77.6  Temp (24hrs), Avg:98.5 F (36.9 C), Min:98.2 F (36.8 C), Max:99 F (37.2 C)  Recent Labs  Lab 04/08/18 0050 04/08/18 0517 04/09/18 0448 04/10/18 0456 04/11/18 0409  WBC 5.6 13.8* 8.2 8.2 6.5  CREATININE 2.60* 2.77* 3.64*  --  3.12*    Estimated Creatinine Clearance: 25.9 mL/min (A) (by C-G formula based on SCr of 3.12 mg/dL (H)).    No Known Allergies  Antimicrobials this admission: Cefazolin 7/20  Zosyn 7/20 >>   Microbiology results: none  Thank you for allowing pharmacy to be a part of this patient's care.  Marty HeckWang, Messi Twedt L, PharmD 04/11/2018 8:28 AM

## 2018-04-11 NOTE — Progress Notes (Signed)
Clinical Child psychotherapistocial Worker (CSW) discussed case with RN this morning who stated that she will get patient to the chair via lift and will monitor if he can tolerate sitting up for 3 hours. [facility] staff is aware of above. Per Cat patient has a cpap. [facility] is aware of above.   [employer]Bailey Yola Paradiso, LCSW [phone]

## 2018-04-11 NOTE — Progress Notes (Signed)
This RN assumed care of pt around 1545. Pt was given fleets enema and had a medium BM. Pt medicated for pain with PRN vicodin. Daughter, Crystal, is at bedside. EMS set up for pt for transportation to facility.   [place], Latricia HeftKorie G

## 2018-04-11 NOTE — Care Management Important Message (Signed)
Important Message  Patient Details  Name: Nathan Freeman MRN: [id number] Date of Birth: 09/11/1951   Medicare Important Message Given:  Yes    [name] 04/11/2018, 10:29 AM

## 2018-04-11 NOTE — Progress Notes (Addendum)
[facility] [facility] [place] [facility]   PATIENT NAME: Nathan Freeman    MR#:  [id number]  DATE OF BIRTH:  Dec 30, 1950  SUBJECTIVE:   Patient complaining of left ankle pain  REVIEW OF SYSTEMS:    Review of Systems  Constitutional: Negative for fever, chills weight loss HENT: Negative for ear pain, nosebleeds, congestion, facial swelling, rhinorrhea, neck pain, neck stiffness and ear discharge.   Respiratory: Negative for cough, shortness of breath, wheezing  Cardiovascular: Negative for chest pain, palpitations and leg swelling.  Gastrointestinal: Negative for heartburn, abdominal pain, vomiting, diarrhea or consitpation Genitourinary: Negative for dysuria, urgency, frequency, hematuria Musculoskeletal: Negative for back pain  Left ankle pain Neurological: Negative for dizziness, seizures, syncope, focal weakness,  numbness and headaches.  Hematological: Does not bruise/bleed easily.  Psychiatric/Behavioral: Negative for hallucinations, confusion, dysphoric mood    Tolerating Diet: yes      DRUG ALLERGIES:  No Known Allergies  VITALS:  Blood pressure 139/69, pulse 66, temperature 98.2 F (36.8 C), temperature source Oral, resp. rate 20, height 6' (1.829 m), weight 83.9 kg (185 lb), SpO2 98 %.  PHYSICAL EXAMINATION:  Constitutional: Appears well-developed and well-nourished. No distress. HENT: Normocephalic. Marland Kitchen Oropharynx is clear and moist.  Eyes: Conjunctivae and EOM are normal. PERRLA, no scleral icterus.  Neck: Normal ROM. Neck supple. No JVD. No tracheal deviation. CVS: RRR, S1/S2 +, no murmurs, no gallops, no carotid bruit.  Pulmonary: Effort and breath sounds normal, no stridor, rhonchi, wheezes, rales.  Abdominal: Soft. BS +,  no distension, tenderness, rebound or guarding.  Musculoskeletal: Ankle with significant pain on palpation and decreased range of motion. No edema and no tenderness.  Neuro: Alert. CN 2-12 grossly intact. No focal  deficits. Skin: Skin is warm and dry. No rash noted. Psychiatric: Normal mood and affect.      LABORATORY PANEL:   CBC Recent Labs  Lab 04/11/18 0409  WBC 6.5  HGB 9.6*  HCT 29.0*  PLT 145*   ------------------------------------------------------------------------------------------------------------------  Chemistries  Recent Labs  Lab 04/08/18 0050  04/11/18 0409  NA 138   < > 137  K 4.0   < > 4.5  CL 99   < > 99  CO2 28   < > 29  GLUCOSE 152*   < > 113*  BUN 25*   < > 30*  CREATININE 2.60*   < > 3.12*  CALCIUM 9.0   < > 8.9  AST 25  --   --   ALT 19  --   --   ALKPHOS 95  --   --   BILITOT 1.0  --   --    < > = values in this interval not displayed.   ------------------------------------------------------------------------------------------------------------------  Cardiac Enzymes No results for input(s): TROPONINI in the last 168 hours. ------------------------------------------------------------------------------------------------------------------  RADIOLOGY:  Dg Foot 2 Views Left  Result Date: 04/09/2018 CLINICAL DATA:  LEFT hip surgery EXAM: LEFT FOOT - 2 VIEW COMPARISON:  None. FINDINGS: No fracture or dislocation of mid foot or forefoot. The phalanges are normal. The calcaneus is normal. No soft tissue abnormality. Vascular calcifications noted IMPRESSION: No acute osseous abnormality. Electronically Signed   By: Genevive Bi M.D.   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  1. Acute comminuted intertrochanteric fracture left femur: Patient is postoperative day #2  intramedullary nail. DVT prophylaxis as per orthopedic surgery with aspirin 325 mg daily . Continue PT and PRN pain medications  2. Left ankle pain: The left foot is negative for fracture.  There are visible  heel spurs.  Orthopedic surgery has recommended CAM Walker boot for the left foot while up with therapy.  3. Essential hypertension: Continue lisinopril, Coreg, Norvasc  4. Depression: Continue Wellbutrin and Celexa  5. Hyperlipidemia: Continue Crestor  6. End-stage renal disease on hemodialysis: He will continue dialysis Monday, Wednesday and Friday.   7. Acute hypoxic respiratory failure in the setting of community acquired pneumonia: Patient was on Zosyn and will be discharged on oral Augmentin.  8.  Mild acute on chronic combined systolic and diastolic heart failure: Patient has no signs and symptoms of exacerbation at this time      Management plans discussed with the patient and family and they are in agreement.  CODE STATUS: limited no MV  TOTAL TIME TAKING CARE OF THIS PATIENT: 22 minutes.     POSSIBLE D/C today to SNF, DEPENDING ON CLINICAL CONDITION.   Chijioke Lasser M.D on 04/11/2018 at 9:31 AM  Between 7am to 6pm - Pager - [phone] After 6pm go to [email or web address] - Social research officer, governmentpassword EPAS [facility]  [facility] [place] [facility]  Office  [phone]  CC: Primary care physician; [employer], [facility]  Note: This dictation was prepared with Nurse, children'sDragon dictation along with smaller Lobbyistphrase technology. Any transcriptional errors that result from this process are unintentional.

## 2018-04-11 NOTE — Progress Notes (Signed)
Pt has not had BM despite PRN PO dulcolax. I asked Dr. Ellouise NewerSigh if pt could have mag citrate PO to help get pt to have BM as pt is an ESRD pt that is on HD. Per Dr. Thedore MinsSingh pt may have one time dose.

## 2018-05-31 ENCOUNTER — Encounter (INDEPENDENT_AMBULATORY_CARE_PROVIDER_SITE_OTHER): Payer: Self-pay

## 2018-06-02 ENCOUNTER — Other Ambulatory Visit (INDEPENDENT_AMBULATORY_CARE_PROVIDER_SITE_OTHER): Payer: Self-pay | Admitting: Nurse Practitioner

## 2018-06-04 MED ORDER — CEFAZOLIN SODIUM-DEXTROSE 1-4 GM/50ML-% IV SOLN
1.0000 g | Freq: Once | INTRAVENOUS | Status: AC
  Administered 2018-06-05: 1 g via INTRAVENOUS

## 2018-06-05 ENCOUNTER — Encounter: Payer: Self-pay | Admitting: *Deleted

## 2018-06-05 ENCOUNTER — Ambulatory Visit
Admission: RE | Admit: 2018-06-05 | Discharge: 2018-06-05 | Disposition: A | Discharge status: Home / Self Care | Payer: Medicare (Managed Care) | Source: Ambulatory Visit | Attending: Vascular Surgery | Admitting: Vascular Surgery

## 2018-06-05 ENCOUNTER — Encounter
Admission: RE | Disposition: A | Discharge status: Home / Self Care | Payer: Self-pay | Source: Ambulatory Visit | Attending: Vascular Surgery

## 2018-06-05 DIAGNOSIS — Z992 Dependence on renal dialysis: Secondary | ICD-10-CM | POA: Insufficient documentation

## 2018-06-05 DIAGNOSIS — I2510 Atherosclerotic heart disease of native coronary artery without angina pectoris: Secondary | ICD-10-CM

## 2018-06-05 DIAGNOSIS — E11649 Type 2 diabetes mellitus with hypoglycemia without coma: Secondary | ICD-10-CM | POA: Insufficient documentation

## 2018-06-05 DIAGNOSIS — N186 End stage renal disease: Secondary | ICD-10-CM | POA: Diagnosis not present

## 2018-06-05 DIAGNOSIS — I132 Hypertensive heart and chronic kidney disease with heart failure and with stage 5 chronic kidney disease, or end stage renal disease: Secondary | ICD-10-CM | POA: Insufficient documentation

## 2018-06-05 DIAGNOSIS — Z87442 Personal history of urinary calculi: Secondary | ICD-10-CM | POA: Insufficient documentation

## 2018-06-05 DIAGNOSIS — Z8673 Personal history of transient ischemic attack (TIA), and cerebral infarction without residual deficits: Secondary | ICD-10-CM | POA: Diagnosis not present

## 2018-06-05 DIAGNOSIS — Z79899 Other long term (current) drug therapy: Secondary | ICD-10-CM | POA: Diagnosis not present

## 2018-06-05 DIAGNOSIS — E1122 Type 2 diabetes mellitus with diabetic chronic kidney disease: Secondary | ICD-10-CM | POA: Insufficient documentation

## 2018-06-05 DIAGNOSIS — E213 Hyperparathyroidism, unspecified: Secondary | ICD-10-CM | POA: Diagnosis not present

## 2018-06-05 DIAGNOSIS — I509 Heart failure, unspecified: Secondary | ICD-10-CM | POA: Insufficient documentation

## 2018-06-05 DIAGNOSIS — F329 Major depressive disorder, single episode, unspecified: Secondary | ICD-10-CM | POA: Insufficient documentation

## 2018-06-05 DIAGNOSIS — Z8249 Family history of ischemic heart disease and other diseases of the circulatory system: Secondary | ICD-10-CM | POA: Insufficient documentation

## 2018-06-05 DIAGNOSIS — G4730 Sleep apnea, unspecified: Secondary | ICD-10-CM | POA: Insufficient documentation

## 2018-06-05 DIAGNOSIS — I10 Essential (primary) hypertension: Secondary | ICD-10-CM

## 2018-06-05 DIAGNOSIS — T82590A Other mechanical complication of surgically created arteriovenous fistula, initial encounter: Secondary | ICD-10-CM | POA: Diagnosis not present

## 2018-06-05 DIAGNOSIS — E785 Hyperlipidemia, unspecified: Secondary | ICD-10-CM | POA: Diagnosis not present

## 2018-06-05 DIAGNOSIS — Z9889 Other specified postprocedural states: Secondary | ICD-10-CM | POA: Diagnosis not present

## 2018-06-05 DIAGNOSIS — Z87891 Personal history of nicotine dependence: Secondary | ICD-10-CM | POA: Insufficient documentation

## 2018-06-05 DIAGNOSIS — Y831 Surgical operation with implant of artificial internal device as the cause of abnormal reaction of the patient, or of later complication, without mention of misadventure at the time of the procedure: Secondary | ICD-10-CM | POA: Diagnosis not present

## 2018-06-05 DIAGNOSIS — Z7982 Long term (current) use of aspirin: Secondary | ICD-10-CM | POA: Insufficient documentation

## 2018-06-05 DIAGNOSIS — T82868A Thrombosis of vascular prosthetic devices, implants and grafts, initial encounter: Secondary | ICD-10-CM | POA: Diagnosis not present

## 2018-06-05 HISTORY — PX: A/V FISTULAGRAM: CATH118298

## 2018-06-05 LAB — POTASSIUM (ARMC VASCULAR LAB ONLY): POTASSIUM (ARMC VASCULAR LAB): 3.9 (ref 3.5–5.1)

## 2018-06-05 SURGERY — A/V FISTULAGRAM
Anesthesia: Moderate Sedation | Laterality: Left

## 2018-06-05 MED ORDER — HEPARIN (PORCINE) IN NACL 1000-0.9 UT/500ML-% IV SOLN
INTRAVENOUS | Status: AC
  Filled 2018-06-05: qty 1000

## 2018-06-05 MED ORDER — MIDAZOLAM HCL 5 MG/5ML IJ SOLN
INTRAMUSCULAR | Status: AC
  Filled 2018-06-05: qty 5

## 2018-06-05 MED ORDER — MIDAZOLAM HCL 2 MG/2ML IJ SOLN
INTRAMUSCULAR | Status: DC | PRN
  Administered 2018-06-05: 2 mg via INTRAVENOUS

## 2018-06-05 MED ORDER — FENTANYL CITRATE (PF) 100 MCG/2ML IJ SOLN
INTRAMUSCULAR | Status: DC | PRN
  Administered 2018-06-05: 50 ug via INTRAVENOUS

## 2018-06-05 MED ORDER — HYDROMORPHONE HCL 1 MG/ML IJ SOLN: 1.0000 mg | Freq: Once | INTRAMUSCULAR | Status: DC | PRN

## 2018-06-05 MED ORDER — SODIUM CHLORIDE 0.9 % IV SOLN
INTRAVENOUS | Status: DC
  Administered 2018-06-05: 14:00:00 via INTRAVENOUS

## 2018-06-05 MED ORDER — LIDOCAINE-EPINEPHRINE (PF) 1 %-1:200000 IJ SOLN
INTRAMUSCULAR | Status: AC
  Filled 2018-06-05: qty 30

## 2018-06-05 MED ORDER — HEPARIN SODIUM (PORCINE) 1000 UNIT/ML IJ SOLN
INTRAMUSCULAR | Status: AC
  Filled 2018-06-05: qty 1

## 2018-06-05 MED ORDER — IOPAMIDOL (ISOVUE-300) INJECTION 61%
INTRAVENOUS | Status: DC | PRN
  Administered 2018-06-05: 20 mL via INTRAVENOUS

## 2018-06-05 MED ORDER — ONDANSETRON HCL 4 MG/2ML IJ SOLN: 4.0000 mg | Freq: Four times a day (QID) | INTRAMUSCULAR | Status: DC | PRN

## 2018-06-05 MED ORDER — FENTANYL CITRATE (PF) 100 MCG/2ML IJ SOLN
INTRAMUSCULAR | Status: AC
  Filled 2018-06-05: qty 2

## 2018-06-05 SURGICAL SUPPLY — 9 items
CANNULA 5F STIFF (CANNULA) ×3 IMPLANT
COVER PROBE U/S 5X48 (MISCELLANEOUS) ×3 IMPLANT
DRAPE BRACHIAL (DRAPES) ×3 IMPLANT
PACK ANGIOGRAPHY (CUSTOM PROCEDURE TRAY) ×3 IMPLANT
SHEATH BRITE TIP 6FRX5.5 (SHEATH) ×3 IMPLANT
SUT MNCRL 4-0 (SUTURE) ×2
SUT MNCRL 4-0 27XMFL (SUTURE) ×1
SUTURE MNCRL 4-0 27XMF (SUTURE) ×1 IMPLANT
TOWEL OR 17X26 4PK STRL BLUE (TOWEL DISPOSABLE) ×3 IMPLANT

## 2018-06-05 NOTE — Discharge Instructions (Signed)
Fistulogram, Care After °Refer to this sheet in the next few weeks. These instructions provide you with information on caring for yourself after your procedure. Your health care provider may also give you more specific instructions. Your treatment has been planned according to current medical practices, but problems sometimes occur. Call your health care provider if you have any problems or questions after your procedure. °What can I expect after the procedure? °After your procedure, it is typical to have the following: °· A small amount of discomfort in the area where the catheters were placed. °· A small amount of bruising around the fistula. °· Sleepiness and fatigue. ° °Follow these instructions at home: °· Rest at home for the day following your procedure. °· Do not drive or operate heavy machinery while taking pain medicine. °· Take medicines only as directed by your health care provider. °· Do not take baths, swim, or use a hot tub until your health care provider approves. You may shower 24 hours after the procedure or as directed by your health care provider. °· There are many different ways to close and cover an incision, including stitches, skin glue, and adhesive strips. Follow your health care provider's instructions on: °? Incision care. °? Bandage (dressing) changes and removal. °? Incision closure removal. °· Monitor your dialysis fistula carefully. °Contact a health care provider if: °· You have drainage, redness, swelling, or pain at your catheter site. °· You have a fever. °· You have chills. °Get help right away if: °· You feel weak. °· You have trouble balancing. °· You have trouble moving your arms or legs. °· You have problems with your speech or vision. °· You can no longer feel a vibration or buzz when you put your fingers over your dialysis fistula. °· The limb that was used for the procedure: °? Swells. °? Is painful. °? Is cold. °? Is discolored, such as blue or pale white. °This  information is not intended to replace advice given to you by your health care provider. Make sure you discuss any questions you have with your health care provider. °Document Released: 01/21/2014 Document Revised: 02/12/2016 Document Reviewed: 10/26/2013 °Elsevier Interactive Patient Education © 2018 [employer] ° ° °Moderate Conscious Sedation, Adult, Care After °These instructions provide you with information about caring for yourself after your procedure. Your health care provider may also give you more specific instructions. Your treatment has been planned according to current medical practices, but problems sometimes occur. Call your health care provider if you have any problems or questions after your procedure. °What can I expect after the procedure? °After your procedure, it is common: °· To feel sleepy for several hours. °· To feel clumsy and have poor balance for several hours. °· To have poor judgment for several hours. °· To vomit if you eat too soon. ° °Follow these instructions at home: °For at least 24 hours after the procedure: ° °· Do not: °? Participate in activities where you could fall or become injured. °? Drive. °? Use heavy machinery. °? Drink alcohol. °? Take sleeping pills or medicines that cause drowsiness. °? Make important decisions or sign legal documents. °? Take care of children on your own. °· Rest. °Eating and drinking °· Follow the diet recommended by your health care provider. °· If you vomit: °? Drink water, juice, or soup when you can drink without vomiting. °? Make sure you have little or no nausea before eating solid foods. °General instructions °· Have a responsible adult stay   with you until you are awake and alert. °· Take over-the-counter and prescription medicines only as told by your health care provider. °· If you smoke, do not smoke without supervision. °· Keep all follow-up visits as told by your health care provider. This is important. °Contact a health care  provider if: °· You keep feeling nauseous or you keep vomiting. °· You feel light-headed. °· You develop a rash. °· You have a fever. °Get help right away if: °· You have trouble breathing. °This information is not intended to replace advice given to you by your health care provider. Make sure you discuss any questions you have with your health care provider. °Document Released: 06/27/2013 Document Revised: 02/09/2016 Document Reviewed: 12/27/2015 °Elsevier Interactive Patient Education © 2018 [employer] ° °

## 2018-06-05 NOTE — Op Note (Signed)
[facility]    OPERATIVE NOTE   PROCEDURE: 1.   Left brachiocephalic arteriovenous fistula cannulation under ultrasound guidance 2.   Left arm fistulagram including central venogram   PRE-OPERATIVE DIAGNOSIS: 1. ESRD 2. Poorly functional left brachiocephalic AVF  POST-OPERATIVE DIAGNOSIS: same as above   SURGEON: Festus Barren, MD  ANESTHESIA: local with MCS  ESTIMATED BLOOD LOSS: 5 cc  FINDING(S): 1. 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  The vessel then went down to a smaller caliber although it was not focally stenotic over about a 5 to 8 cm span.  At this location, it was in the 5 to 6 mm range without any focal stenosis.  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  There is some tortuosity in the upper arm cephalic vein but no significant stenosis in the remainder of the cephalic vein or the central venous circulation.  SPECIMEN(S):  None  CONTRAST: 20 cc  FLUORO TIME: 0.2 minutes  MODERATE CONSCIOUS SEDATION TIME: Approximately 20 minutes with 2 mg of Versed and 50 Mcg of Fentanyl   INDICATIONS: Nathan Freeman is a 67 y.o. male who presents with malfunctioning left brachiocephalic arteriovenous fistula.  The patient is scheduled for left arm fistulagram.  The patient is aware the risks include but are not limited to: bleeding, infection, thrombosis of the cannulated access, and possible anaphylactic reaction to the contrast.  The patient is aware of the risks of the procedure and elects to proceed forward.  DESCRIPTION: After full informed written consent was obtained, the patient was brought back to the angiography suite and placed supine upon the angiography table.  The patient was connected to monitoring equipment. Moderate conscious sedation was administered with a face to face  encounter with the patient throughout the procedure with my supervision of the RN administering medicines and monitoring the patient's vital signs and mental status throughout from the start of the procedure until the patient was taken to the recovery room. The left arm was prepped and draped in the standard fashion for a percutaneous access intervention.  Under ultrasound guidance, the left brachiocephalic arteriovenous fistula was cannulated with a micropuncture needle under direct ultrasound guidance and a permanent image was performed.  The microwire was advanced into the fistula and the needle was exchanged for the a microsheath.  I then upsized to a 6 Fr Sheath and imaging was performed.  Hand injections were completed to image the access including the central venous system. 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  The vessel then went down to a smaller caliber although it was not focally stenotic over about a 5 to 8 cm span.  At this location, it was in the 5 to 6 mm range without any focal stenosis.  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  There is some tortuosity in the upper arm cephalic vein but no significant stenosis in the remainder of the cephalic vein or the central venous circulation.  I did not feel that the relative small area between aneurysmal sites was actually a functional stenosis and did not feel like angioplasty or stenting of this area would be of much benefit.  Based on the images, this patient will need no intervention today.  A 4-0 Monocryl purse-string suture was sewn around the sheath.  The sheath was removed while tying down the suture.  A sterile bandage was applied to the puncture site.  COMPLICATIONS: None  CONDITION: Stable   Festus BarrenJason Dew  06/05/2018 3:32 PM   This note was created with Dragon Medical  transcription system. Any errors in dictation are purely unintentional.

## 2018-06-06 ENCOUNTER — Encounter: Payer: Self-pay | Admitting: Vascular Surgery

## 2018-06-09 NOTE — H&P (Signed)
[facility] Admission History & Physical  MRN : [id number]  Nathan Freeman is a 67 y.o. (06/25/1951) male who presents with chief complaint of No chief complaint on file. Marland Kitchen.  History of Present Illness: I am asked to evaluate the patient by the dialysis center. The patient was sent here because they were unable to achieve adequate dialysis this morning. Furthermore the Center states there is very poor thrill and bruit. The patient states there there have been increasing problems with the access, such as "pulling clots" during dialysis and prolonged bleeding after decannulation. The patient estimates these problems have been going on for several weeks. The patient is unaware of any other change.  Patient denies pain or tenderness overlying the access.  There is no pain with dialysis.  The patient denies hand pain or finger pain consistent with steal syndrome.   There have not been any recent past interventions or declots of this access.  The patient is not chronically hypotensive on dialysis.  No current facility-administered medications for this encounter.    Current Outpatient Medications  Medication Sig Dispense Refill  . acetaminophen (TYLENOL) 650 MG CR tablet Take 650 mg by mouth 2 (two) times daily.     Marland Kitchen. albuterol (PROVENTIL HFA;VENTOLIN HFA) 108 (90 Base) MCG/ACT inhaler Inhale 2 puffs into the lungs every 4 (four) hours as needed for wheezing or shortness of breath.    Marland Kitchen. albuterol (PROVENTIL) (2.5 MG/3ML) 0.083% nebulizer solution Take 2.5 mg by nebulization every 6 (six) hours as needed for wheezing or shortness of breath.    Marland Kitchen. amLODipine (NORVASC) 10 MG tablet Take 10 mg by mouth daily.     Marland Kitchen. aspirin EC 325 MG EC tablet Take 1 tablet (325 mg total) by mouth daily with breakfast. 30 tablet 0  . buPROPion (WELLBUTRIN XL) 150 MG 24 hr tablet Take 150 mg by mouth daily.    . calcitRIOL (ROCALTROL) 0.25 MCG capsule Take 0.25 mcg by mouth 3 (three) times a  week. On dialysis days (MON, WED, FRI)    . carvedilol (COREG) 25 MG tablet Take 25 mg by mouth 2 (two) times daily with a meal.    . citalopram (CELEXA) 20 MG tablet Take 20 mg by mouth daily.     Marland Kitchen. docusate sodium (COLACE) 100 MG capsule Take 100 mg by mouth daily as needed for mild constipation.    . fish oil-omega-3 fatty acids 1000 MG capsule Take 2 capsules by mouth daily with supper.     . fluticasone (FLONASE) 50 MCG/ACT nasal spray Place 2 sprays into both nostrils daily as needed for allergies or rhinitis.    Marland Kitchen. guaiFENesin (MUCINEX) 600 MG 12 hr tablet Take 600 mg by mouth 2 (two) times daily as needed for cough.    Marland Kitchen. HYDROcodone-acetaminophen (NORCO) 5-325 MG tablet Take 1 tablet by mouth 2 (two) times daily. 30 tablet 0  . lidocaine (LIDODERM) 5 % Place 2 patches onto the skin daily. 1 patch to left lower back and 1 patch to right lower back. Remove after 12 hours.    Marland Kitchen. lisinopril (PRINIVIL,ZESTRIL) 10 MG tablet Take 10 mg by mouth daily.    Marland Kitchen. liver oil-zinc oxide (DESITIN) 40 % ointment Apply 1 application topically 2 (two) times daily. And as needed with toileting for skin protection    . polycarbophil (FIBERCON) 625 MG tablet Take 625 mg by mouth 2 (two) times daily.    . ranitidine (ZANTAC) 150 MG tablet Take 150 mg by  mouth 2 (two) times daily.    . rosuvastatin (CRESTOR) 10 MG tablet Take 10 mg by mouth daily.    Marland Kitchen senna-docusate (SENOKOT-S) 8.6-50 MG tablet Take 1 tablet by mouth at bedtime.    . sevelamer carbonate (RENVELA) 800 MG tablet Take 800 mg by mouth 3 (three) times daily with meals.    . tamsulosin (FLOMAX) 0.4 MG CAPS capsule Take 0.4 mg by mouth daily.    Marland Kitchen thiamine 100 MG tablet Take by mouth.    . chlorpheniramine-HYDROcodone (TUSSIONEX) 10-8 MG/5ML SUER Take 5 mLs by mouth every 12 (twelve) hours as needed for cough. (Patient not taking: Reported on 04/08/2018) 140 mL 0  . ondansetron (ZOFRAN ODT) 4 MG disintegrating tablet Take 1 tablet (4 mg total) by mouth  every 8 (eight) hours as needed for nausea or vomiting. (Patient not taking: Reported on 04/08/2018) 20 tablet 0  . pantoprazole (PROTONIX) 40 MG tablet Take 40 mg by mouth daily.      Past Medical History:  Diagnosis Date  . Acute pericarditis    Probable, Echo (7/10) showed mild LVH, normal LV size and systolic function, EF 55-60%, normal wall motion, mild MR, no AS, partially loculated small effusion along the RA free wall  . Anemia   . CHF (congestive heart failure) (HCC)   . DDD (degenerative disc disease), lumbar    Spine  . Depression   . Diabetes mellitus type II    Poor control due to medication noncompliance. Last hgbA1c 13.2  . Edema   . Hepatitis, unspecified   . Hyperlipidemia   . Hyperparathyroidism (HCC)   . Hypertension    Cough with a BP med in teh past, sounds like ACEI  . Kidney stones    dialysis t,t,s  . Malnutrition (HCC)   . Sleep apnea    cpap  . Stroke [facility])    02/2013 left side weakness    Past Surgical History:  Procedure Laterality Date  . A/V FISTULAGRAM Left 06/05/2018   Procedure: A/V FISTULAGRAM;  Surgeon: Annice Needy, MD;  Location: [facility] INVASIVE CV LAB;  Service: Cardiovascular;  Laterality: Left;  . AV FISTULA PLACEMENT Left 12/11/2015   Procedure: ARTERIOVENOUS (AV) FISTULA CREATION ,BRACHIOCEPHALIC;  Surgeon: Annice Needy, MD;  Location: [facility] ORS;  Service: Vascular;  Laterality: Left;  . COLONOSCOPY    . COLONOSCOPY WITH PROPOFOL N/A 05/17/2016   Procedure: COLONOSCOPY WITH PROPOFOL;  Surgeon: Christena Deem, MD;  Location: [facility] ENDOSCOPY;  Service: Endoscopy;  Laterality: N/A;  . FEMUR IM NAIL Left 04/08/2018   Procedure: INTRAMEDULLARY (IM) NAIL FEMORAL;  Surgeon: Kennedy Bucker, MD;  Location: [facility] ORS;  Service: Orthopedics;  Laterality: Left;  . INSERTION OF DIALYSIS CATHETER Right    chest  . LITHOTRIPSY    . ROTATOR CUFF REPAIR     Left shoulder, 2 times  . TONSILLECTOMY      Social History Social History   Tobacco Use  .  Smoking status: Former Smoker    Types: Cigarettes    Last attempt to quit: 09/20/1972    Years since quitting: 45.7  . Smokeless tobacco: Never Used  . Tobacco comment: Quit at age 20  Substance Use Topics  . Alcohol use: No  . Drug use: No    Family History Family History  Problem Relation Age of Onset  . Heart failure Mother   . Ulcers Mother        Venous ulcers  . Heart failure Father   . Heart  attack Brother        3 heart attacks    No family history of bleeding or clotting disorders, autoimmune disease or porphyria  No Known Allergies   REVIEW OF SYSTEMS (Negative unless checked)  Constitutional: [] Weight loss  [] Fever  [] Chills Cardiac: [] Chest pain   [] Chest pressure   [] Palpitations   [] Shortness of breath when laying flat   [] Shortness of breath at rest   [x] Shortness of breath with exertion. Vascular:  [] Pain in legs with walking   [] Pain in legs at rest   [] Pain in legs when laying flat   [] Claudication   [] Pain in feet when walking  [] Pain in feet at rest  [] Pain in feet when laying flat   [] History of DVT   [] Phlebitis   [] Swelling in legs   [] Varicose veins   [] Non-healing ulcers Pulmonary:   [] Uses home oxygen   [] Productive cough   [] Hemoptysis   [] Wheeze  [] COPD   [] Asthma Neurologic:  [] Dizziness  [] Blackouts   [] Seizures   [x] History of stroke   [x] History of TIA  [] Aphasia   [] Temporary blindness   [] Dysphagia   [x] Weakness or numbness in arms   [x] Weakness or numbness in legs Musculoskeletal:  [x] Arthritis   [] Joint swelling   [] Joint pain   [] Low back pain Hematologic:  [] Easy bruising  [] Easy bleeding   [] Hypercoagulable state   [] Anemic  [] Hepatitis Gastrointestinal:  [] Blood in stool   [] Vomiting blood  [] Gastroesophageal reflux/heartburn   [] Difficulty swallowing. Genitourinary:  [x] Chronic kidney disease   [] Difficult urination  [] Frequent urination  [] Burning with urination   [] Blood in urine Skin:  [] Rashes   [] Ulcers   [] Wounds Psychological:   [] History of anxiety   []  History of major depression.  Physical Examination  Vitals:   06/05/18 1520 06/05/18 1533 06/05/18 1545 06/05/18 1600  BP:  (!) 143/63 140/70 137/67  Pulse:  (!) 52 (!) 56 (!) 58  Resp:  15 14 15   Temp:      SpO2: 92% 91% 91% 94%  Weight:      Height:       Body mass index is 27.12 kg/m. Gen: WD/WN, NAD Head: [place]/AT, No temporalis wasting. Ear/Nose/Throat: Hearing grossly intact, nares w/o erythema or drainage, oropharynx w/o Erythema/Exudate,  Eyes: Conjunctiva clear, sclera non-icteric Neck: Trachea midline.  No JVD.  Pulmonary:  Good air movement, respirations not labored, no use of accessory muscles.  Cardiac: RRR, normal S1, S2. Vascular: aneurysmal left arm AVF with thrill present Vessel Right Left  Radial Palpable Palpable   Musculoskeletal: M/S 5/5 throughout.  Extremities without ischemic changes.  No deformity or atrophy.  Neurologic: Sensation grossly intact in extremities.  Symmetrical.  Speech is fluent. Motor exam as listed above. Psychiatric: Judgment intact, Mood & affect appropriate for pt's clinical situation. Dermatologic: No rashes or ulcers noted.  No cellulitis or open wounds.    CBC Lab Results  Component Value Date   WBC 6.5 04/11/2018   HGB 9.6 (L) 04/11/2018   HCT 29.0 (L) 04/11/2018   MCV 97.9 04/11/2018   PLT 145 (L) 04/11/2018    BMET    Component Value Date/Time   NA 137 04/11/2018 0409   NA 140 03/30/2013 0403   K 4.5 04/11/2018 0409   K 3.1 (L) 03/30/2013 0403   CL 99 04/11/2018 0409   CL 106 03/30/2013 0403   CO2 29 04/11/2018 0409   CO2 29 03/30/2013 0403   GLUCOSE 113 (H) 04/11/2018 0409   GLUCOSE 119 (H)  03/30/2013 0403   BUN 30 (H) 04/11/2018 0409   BUN 20 (H) 03/30/2013 0403   CREATININE 3.12 (H) 04/11/2018 0409   CREATININE 1.17 03/30/2013 0403   CALCIUM 8.9 04/11/2018 0409   CALCIUM 8.5 03/30/2013 0403   GFRNONAA 19 (L) 04/11/2018 0409   GFRNONAA >60 03/30/2013 0403   GFRAA 23 (L)  04/11/2018 0409   GFRAA >60 03/30/2013 0403   CrCl cannot be calculated (Patient's most recent lab result is older than the maximum 21 days allowed.).  COAG Lab Results  Component Value Date   INR 1.23 12/03/2015   INR 1.0 03/28/2013    Radiology No results found.  Assessment/Plan 1.  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 The patient will undergo angiography and correction of any problems using interventional techniques with the hope of restoring function to the access.  The risks and benefits were described to the patient.  All questions were answered.  The patient agrees to proceed with angiography and intervention. Potassium will be drawn to ensure that it is an appropriate level prior to performing intervention. 2.  End-stage renal disease requiring hemodialysis:  Patient will continue dialysis therapy without further interruption if a successful intervention is not achieved then a tunneled catheter will be placed. Dialysis has already been arranged. 3.  Hypertension:  Patient will continue medical management; nephrology is following no changes in oral medications. 4.  Diabetes mellitus:  Glucose will be monitored and oral medications been held this morning once the patient has undergone the patient's procedure po intake will be reinitiated and again Accu-Cheks will be used to assess the blood glucose level and treat as needed. The patient will be restarted on the patient's usual hypoglycemic regime 5.  Coronary artery disease:  EKG will be monitored. Nitrates will be used if needed. The patient's oral cardiac medications will be continued.    Festus Barren, MD  06/09/2018 4:08 PM

## 2018-07-10 ENCOUNTER — Emergency Department
Admission: EM | Admit: 2018-07-10 | Discharge: 2018-07-10 | Disposition: A | Discharge status: Home / Self Care | Payer: Medicare (Managed Care) | Attending: Student in an Organized Health Care Education/Training Program | Admitting: Student in an Organized Health Care Education/Training Program

## 2018-07-10 ENCOUNTER — Emergency Department: Payer: Medicare (Managed Care)

## 2018-07-10 ENCOUNTER — Other Ambulatory Visit: Payer: Self-pay

## 2018-07-10 DIAGNOSIS — E1122 Type 2 diabetes mellitus with diabetic chronic kidney disease: Secondary | ICD-10-CM | POA: Insufficient documentation

## 2018-07-10 DIAGNOSIS — Y9389 Activity, other specified: Secondary | ICD-10-CM | POA: Insufficient documentation

## 2018-07-10 DIAGNOSIS — S0003XA Contusion of scalp, initial encounter: Secondary | ICD-10-CM | POA: Insufficient documentation

## 2018-07-10 DIAGNOSIS — S0990XA Unspecified injury of head, initial encounter: Secondary | ICD-10-CM | POA: Diagnosis present

## 2018-07-10 DIAGNOSIS — M542 Cervicalgia: Secondary | ICD-10-CM | POA: Diagnosis not present

## 2018-07-10 DIAGNOSIS — Z992 Dependence on renal dialysis: Secondary | ICD-10-CM | POA: Insufficient documentation

## 2018-07-10 DIAGNOSIS — I5042 Chronic combined systolic (congestive) and diastolic (congestive) heart failure: Secondary | ICD-10-CM | POA: Insufficient documentation

## 2018-07-10 DIAGNOSIS — I132 Hypertensive heart and chronic kidney disease with heart failure and with stage 5 chronic kidney disease, or end stage renal disease: Secondary | ICD-10-CM | POA: Insufficient documentation

## 2018-07-10 DIAGNOSIS — Z7982 Long term (current) use of aspirin: Secondary | ICD-10-CM | POA: Insufficient documentation

## 2018-07-10 DIAGNOSIS — W1789XA Other fall from one level to another, initial encounter: Secondary | ICD-10-CM | POA: Insufficient documentation

## 2018-07-10 DIAGNOSIS — Z87891 Personal history of nicotine dependence: Secondary | ICD-10-CM | POA: Diagnosis not present

## 2018-07-10 DIAGNOSIS — Y9289 Other specified places as the place of occurrence of the external cause: Secondary | ICD-10-CM | POA: Insufficient documentation

## 2018-07-10 DIAGNOSIS — Y998 Other external cause status: Secondary | ICD-10-CM | POA: Insufficient documentation

## 2018-07-10 DIAGNOSIS — Z8673 Personal history of transient ischemic attack (TIA), and cerebral infarction without residual deficits: Secondary | ICD-10-CM | POA: Insufficient documentation

## 2018-07-10 DIAGNOSIS — N186 End stage renal disease: Secondary | ICD-10-CM | POA: Diagnosis not present

## 2018-07-10 DIAGNOSIS — R51 Headache: Secondary | ICD-10-CM | POA: Insufficient documentation

## 2018-07-10 DIAGNOSIS — Z79899 Other long term (current) drug therapy: Secondary | ICD-10-CM | POA: Diagnosis not present

## 2018-07-10 NOTE — ED Triage Notes (Signed)
Pt brought in by [facility] due to fall on wheelchair van. He was released from rehab today (in rehab for recent CVA). States he was being taken home and when he stood up to get off the [place], his foot got caught in the wheelchair and he fell forward. He struck the left side of his head, hematoma noted. Denies LOC. Denies dizziness prior to fall. Denies chest pain. Denies shortness or breath. Only c/o headache to left side. A&Ox4.

## 2018-07-10 NOTE — Discharge Instructions (Addendum)

## 2018-07-10 NOTE — ED Provider Notes (Signed)
[facility] Emergency Department Provider Note    First MD Initiated Contact with Patient 07/10/18 1723     (approximate)  I have reviewed the triage vital signs and the nursing notes.   HISTORY  Chief Complaint Fall    HPI Nathan Freeman is a 67 y.o. male presents to the ER after being discharged from rehab today for a hip fracture currently on  aspirin for reported history of CVA had a mechanical fall while getting out of the wheelchair van.  States that he was trying to move out of the way as he was getting down from the [place].  His foot got stuck on 1 of the wheelchair locking mechanisms and he fell backwards into the van hitting his left side of his head against the back door.  Is having some mild neck pain and headache.  Denies any numbness or tingling.  Did not lose consciousness but felt like he was about to.  Denies any chest pain or shortness of breath.   Past Medical History:  Diagnosis Date  . Acute pericarditis    Probable, Echo (7/10) showed mild LVH, normal LV size and systolic function, EF 55-60%, normal wall motion, mild MR, no AS, partially loculated small effusion along the RA free wall  . Anemia   . CHF (congestive heart failure) (HCC)   . DDD (degenerative disc disease), lumbar    Spine  . Depression   . Diabetes mellitus type II    Poor control due to medication noncompliance. Last hgbA1c 13.2  . Edema   . Hepatitis, unspecified   . Hyperlipidemia   . Hyperparathyroidism (HCC)   . Hypertension    Cough with a BP med in teh past, sounds like ACEI  . Kidney stones    dialysis t,t,s  . Malnutrition (HCC)   . Sleep apnea    cpap  . Stroke [facility])    02/2013 left side weakness   Family History  Problem Relation Age of Onset  . Heart failure Mother   . Ulcers Mother        Venous ulcers  . Heart failure Father   . Heart attack Brother        3 heart attacks   Past Surgical History:  Procedure Laterality Date  . A/V  FISTULAGRAM Left 06/05/2018   Procedure: A/V FISTULAGRAM;  Surgeon: Annice Needy, MD;  Location: [facility] INVASIVE CV LAB;  Service: Cardiovascular;  Laterality: Left;  . AV FISTULA PLACEMENT Left 12/11/2015   Procedure: ARTERIOVENOUS (AV) FISTULA CREATION ,BRACHIOCEPHALIC;  Surgeon: Annice Needy, MD;  Location: [facility] ORS;  Service: Vascular;  Laterality: Left;  . COLONOSCOPY    . COLONOSCOPY WITH PROPOFOL N/A 05/17/2016   Procedure: COLONOSCOPY WITH PROPOFOL;  Surgeon: Christena Deem, MD;  Location: [facility] ENDOSCOPY;  Service: Endoscopy;  Laterality: N/A;  . FEMUR IM NAIL Left 04/08/2018   Procedure: INTRAMEDULLARY (IM) NAIL FEMORAL;  Surgeon: Kennedy Bucker, MD;  Location: [facility] ORS;  Service: Orthopedics;  Laterality: Left;  . INSERTION OF DIALYSIS CATHETER Right    chest  . LITHOTRIPSY    . ROTATOR CUFF REPAIR     Left shoulder, 2 times  . TONSILLECTOMY     Patient Active Problem List   Diagnosis Date Noted  . Hip fx (HCC) 04/08/2018  . Fluid overload 01/06/2018  . Acute on chronic combined systolic and diastolic CHF (congestive heart failure) (HCC) 01/06/2018  . Diabetes (HCC) 01/06/2018  . ESRD on dialysis (HCC) 01/06/2018  .  HLD (hyperlipidemia) 04/03/2009  . HTN (hypertension) 04/03/2009  . PERICARDITIS, ACUTE 04/03/2009  . SHORTNESS OF BREATH 04/03/2009      Prior to Admission medications   Medication Sig Start Date End Date Taking? Authorizing Provider  acetaminophen (TYLENOL) 650 MG CR tablet Take 650 mg by mouth 2 (two) times daily.     [provider]  albuterol (PROVENTIL HFA;VENTOLIN HFA) 108 (90 Base) MCG/ACT inhaler Inhale 2 puffs into the lungs every 4 (four) hours as needed for wheezing or shortness of breath.    [provider]  albuterol (PROVENTIL) (2.5 MG/3ML) 0.083% nebulizer solution Take 2.5 mg by nebulization every 6 (six) hours as needed for wheezing or shortness of breath.    [provider]  amLODipine (NORVASC) 10 MG tablet Take 10 mg  by mouth daily.     [provider]  aspirin EC 325 MG EC tablet Take 1 tablet (325 mg total) by mouth daily with breakfast. 04/11/18   Adrian Saran, MD  buPROPion (WELLBUTRIN XL) 150 MG 24 hr tablet Take 150 mg by mouth daily.    [provider]  calcitRIOL (ROCALTROL) 0.25 MCG capsule Take 0.25 mcg by mouth 3 (three) times a week. On dialysis days (MON, WED, FRI)    [provider]  carvedilol (COREG) 25 MG tablet Take 25 mg by mouth 2 (two) times daily with a meal.    [provider]  chlorpheniramine-HYDROcodone (TUSSIONEX) 10-8 MG/5ML SUER Take 5 mLs by mouth every 12 (twelve) hours as needed for cough. Patient not taking: Reported on 04/08/2018 01/08/18   Milagros Loll, MD  citalopram (CELEXA) 20 MG tablet Take 20 mg by mouth daily.     [provider]  docusate sodium (COLACE) 100 MG capsule Take 100 mg by mouth daily as needed for mild constipation.    [provider]  fish oil-omega-3 fatty acids 1000 MG capsule Take 2 capsules by mouth daily with supper.     [provider]  fluticasone (FLONASE) 50 MCG/ACT nasal spray Place 2 sprays into both nostrils daily as needed for allergies or rhinitis.    [provider]  guaiFENesin (MUCINEX) 600 MG 12 hr tablet Take 600 mg by mouth 2 (two) times daily as needed for cough.    [provider]  HYDROcodone-acetaminophen (NORCO) 5-325 MG tablet Take 1 tablet by mouth 2 (two) times daily. 04/10/18   Adrian Saran, MD  lidocaine (LIDODERM) 5 % Place 2 patches onto the skin daily. 1 patch to left lower back and 1 patch to right lower back. Remove after 12 hours.    [provider]  lisinopril (PRINIVIL,ZESTRIL) 10 MG tablet Take 10 mg by mouth daily.    [provider]  liver oil-zinc oxide (DESITIN) 40 % ointment Apply 1 application topically 2 (two) times daily. And as needed with toileting for skin protection    [provider]  ondansetron (ZOFRAN  ODT) 4 MG disintegrating tablet Take 1 tablet (4 mg total) by mouth every 8 (eight) hours as needed for nausea or vomiting. Patient not taking: Reported on 04/08/2018 03/01/18   Nita Sickle, MD  pantoprazole (PROTONIX) 40 MG tablet Take 40 mg by mouth daily.    [provider]  polycarbophil (FIBERCON) 625 MG tablet Take 625 mg by mouth 2 (two) times daily.    [provider]  ranitidine (ZANTAC) 150 MG tablet Take 150 mg by mouth 2 (two) times daily.    [provider]  rosuvastatin (CRESTOR)  10 MG tablet Take 10 mg by mouth daily.    [provider]  senna-docusate (SENOKOT-S) 8.6-50 MG tablet Take 1 tablet by mouth at bedtime.    [provider]  sevelamer carbonate (RENVELA) 800 MG tablet Take 800 mg by mouth 3 (three) times daily with meals.    [provider]  tamsulosin (FLOMAX) 0.4 MG CAPS capsule Take 0.4 mg by mouth daily.    [provider]  thiamine 100 MG tablet Take by mouth. 02/09/18 02/09/19  [provider]    Allergies Patient has no known allergies.    Social History Social History   Tobacco Use  . Smoking status: Former Smoker    Types: Cigarettes    Last attempt to quit: 09/20/1972    Years since quitting: 45.8  . Smokeless tobacco: Never Used  . Tobacco comment: Quit at age 45  Substance Use Topics  . Alcohol use: No  . Drug use: No    Review of Systems Patient denies headaches, rhinorrhea, blurry vision, numbness, shortness of breath, chest pain, edema, cough, abdominal pain, nausea, vomiting, diarrhea, dysuria, fevers, rashes or hallucinations unless otherwise stated above in HPI. ____________________________________________   PHYSICAL EXAM:  VITAL SIGNS: Vitals:   07/10/18 1800 07/10/18 1830  BP: (!) 141/71 138/81  Pulse: 64 63  Resp: 16 16  Temp:    SpO2: 94% 90%    Constitutional: Alert and oriented.  Eyes: Conjunctivae are normal.  Head: Atraumatic. Nose: No  congestion/rhinnorhea. Mouth/Throat: Mucous membranes are moist.   Neck: No stridor. Painless ROM.  Cardiovascular: Normal rate, regular rhythm. Grossly normal heart sounds.  Good peripheral circulation. Respiratory: Normal respiratory effort.  No retractions. Lungs CTAB. Gastrointestinal: Soft and nontender. No distention. No abdominal bruits. No CVA tenderness. Genitourinary:  Musculoskeletal: No lower extremity tenderness nor edema.  No joint effusions. Neurologic:  Normal speech and language. No gross focal neurologic deficits are appreciated. No facial droop Skin:  Skin is warm, dry and intact. No rash noted. Psychiatric: Mood and affect are normal. Speech and behavior are normal.  ____________________________________________   LABS (all labs ordered are listed, but only abnormal results are displayed)  No results found for this or any previous visit (from the past 24 hour(s)). ____________________________________________  EKG My review and personal interpretation at Time: 17:22   Indication: fall  Rate: 60  Rhythm: sinus Axis: normal Other: normal intervls ____________________________________________  RADIOLOGY  Normal inter ____________________________________________   PROCEDURES  Procedure(s) performed:  Procedures    Critical Care performed: no ____________________________________________   INITIAL IMPRESSION / ASSESSMENT AND PLAN / ED COURSE  Pertinent labs & imaging results that were available during my care of the patient were reviewed by me and considered in my medical decision making (see chart for details).   DDX: sah,sdh, iph, fracture, concussion  Nathan Freeman is a 67 y.o. who presents to the ED with fall and head injury as described above.  Patient otherwise in no acute distress.  Remainder of trauma exam is benign.  No evidence of laceration.  Clinical Course as of Jul 10 1906  Mon Jul 10, 2018  1610 Discussed results of chest x-ray with  patient.  Reportedly this is a known effusion that he and his pace physician have been increasing his dialysis.  Denies any shortness of breath chest pain or cough.  No hypoxia.  At this point I do believe patient stable and appropriate for outpatient follow-up.   [PR]    Clinical Course User Index [  PR] Willy Eddy, MD     As part of my medical decision making, I reviewed the following data within the electronic MEDICAL RECORD NUMBER Nursing notes reviewed and incorporated, Labs reviewed, notes from prior ED visits and [place] Controlled Substance Database   ____________________________________________   FINAL CLINICAL IMPRESSION(S) / ED DIAGNOSES  Final diagnoses:  Minor head injury, initial encounter  Contusion of scalp, initial encounter      NEW MEDICATIONS STARTED DURING THIS VISIT:  New Prescriptions   No medications on file     Note:  This document was prepared using Dragon voice recognition software and may include unintentional dictation errors.    Willy Eddy, MD 07/10/18 Nicholos Johns

## 2018-08-02 ENCOUNTER — Encounter (INDEPENDENT_AMBULATORY_CARE_PROVIDER_SITE_OTHER): Payer: Medicare (Managed Care)

## 2018-08-02 ENCOUNTER — Ambulatory Visit (INDEPENDENT_AMBULATORY_CARE_PROVIDER_SITE_OTHER): Payer: Medicare (Managed Care) | Admitting: Nurse Practitioner

## 2018-08-22 ENCOUNTER — Other Ambulatory Visit (INDEPENDENT_AMBULATORY_CARE_PROVIDER_SITE_OTHER): Payer: Self-pay | Admitting: Vascular Surgery

## 2018-08-22 DIAGNOSIS — N186 End stage renal disease: Secondary | ICD-10-CM

## 2018-08-23 ENCOUNTER — Ambulatory Visit (INDEPENDENT_AMBULATORY_CARE_PROVIDER_SITE_OTHER): Payer: Medicare (Managed Care) | Admitting: Nurse Practitioner

## 2018-08-23 ENCOUNTER — Encounter (INDEPENDENT_AMBULATORY_CARE_PROVIDER_SITE_OTHER): Payer: Medicare (Managed Care)

## 2018-08-25 ENCOUNTER — Ambulatory Visit (INDEPENDENT_AMBULATORY_CARE_PROVIDER_SITE_OTHER): Payer: Medicare (Managed Care)

## 2018-08-25 ENCOUNTER — Ambulatory Visit (INDEPENDENT_AMBULATORY_CARE_PROVIDER_SITE_OTHER): Payer: Medicare (Managed Care) | Admitting: Nurse Practitioner

## 2018-08-25 ENCOUNTER — Encounter (INDEPENDENT_AMBULATORY_CARE_PROVIDER_SITE_OTHER): Payer: Self-pay | Admitting: Nurse Practitioner

## 2018-08-25 VITALS — BP 121/57 | HR 55 | Ht 72.0 in | Wt 158.0 lb

## 2018-08-25 DIAGNOSIS — N186 End stage renal disease: Secondary | ICD-10-CM

## 2018-08-25 DIAGNOSIS — I10 Essential (primary) hypertension: Secondary | ICD-10-CM

## 2018-08-25 DIAGNOSIS — E785 Hyperlipidemia, unspecified: Secondary | ICD-10-CM | POA: Diagnosis not present

## 2018-08-28 NOTE — Progress Notes (Signed)
Subjective:    Patient ID: Nathan Freeman, male    DOB: 07/28/1951, 67 y.o.   MRN: [id number] Chief Complaint  Patient presents with  . Vascular Access Problem    Access follow up    HPI  Nathan HartshornGerry W Freeman is a 67 y.o. male The patient returns to the office for followup status post intervention of the dialysis access left brachiocephalic AV fistula. Following the intervention the access function has significantly improved, with better flow rates and improved KT/V. The patient has not been experiencing increased bleeding times following decannulation and the patient denies increased recirculation. The patient denies an increase in arm swelling. At the present time the patient denies hand pain.  The patient denies amaurosis fugax or recent TIA symptoms. There are no recent neurological changes noted. The patient denies claudication symptoms or rest pain symptoms. The patient denies history of DVT, PE or superficial thrombophlebitis. The patient denies recent episodes of angina or shortness of breath.   Underwent a hemodialysis access today which revealed a flow volume of 1351.  The distal upper arm has elevated velocities, however they do not create a hemodynamically significant stenosis.     Past Medical History:  Diagnosis Date  . Acute pericarditis    Probable, Echo (7/10) showed mild LVH, normal LV size and systolic function, EF 55-60%, normal wall motion, mild MR, no AS, partially loculated small effusion along the RA free wall  . Anemia   . CHF (congestive heart failure) (HCC)   . DDD (degenerative disc disease), lumbar    Spine  . Depression   . Diabetes mellitus type II    Poor control due to medication noncompliance. Last hgbA1c 13.2  . Edema   . Hepatitis, unspecified   . Hyperlipidemia   . Hyperparathyroidism (HCC)   . Hypertension    Cough with a BP med in teh past, sounds like ACEI  . Kidney stones    dialysis t,t,s  . Malnutrition (HCC)   . Sleep apnea    cpap    . Stroke [facility](HCC)    02/2013 left side weakness    Past Surgical History:  Procedure Laterality Date  . A/V FISTULAGRAM Left 06/05/2018   Procedure: A/V FISTULAGRAM;  Surgeon: Annice Needyew, Jason S, MD;  Location: [facility] INVASIVE CV LAB;  Service: Cardiovascular;  Laterality: Left;  . AV FISTULA PLACEMENT Left 12/11/2015   Procedure: ARTERIOVENOUS (AV) FISTULA CREATION ,BRACHIOCEPHALIC;  Surgeon: Annice NeedyJason S Dew, MD;  Location: [facility] ORS;  Service: Vascular;  Laterality: Left;  . COLONOSCOPY    . COLONOSCOPY WITH PROPOFOL N/A 05/17/2016   Procedure: COLONOSCOPY WITH PROPOFOL;  Surgeon: Christena DeemMartin U Skulskie, MD;  [facility];  Service: Endoscopy;  Laterality: N/A;  . FEMUR IM NAIL Left 04/08/2018   Procedure: INTRAMEDULLARY (IM) NAIL FEMORAL;  Surgeon: Kennedy BuckerMenz, Michael, MD;  Location: [facility] ORS;  Service: Orthopedics;  Laterality: Left;  . INSERTION OF DIALYSIS CATHETER Right    chest  . LITHOTRIPSY    . ROTATOR CUFF REPAIR     Left shoulder, 2 times  . TONSILLECTOMY      Social History   Socioeconomic History  . Marital status: Widowed    Spouse name: Not on file  . Number of children: Not on file  . Years of education: Not on file  . Highest education level: Not on file  Occupational History  . 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 Financial resource strain: Not on file  . Food insecurity:  Worry: Not on file    Inability: Not on file  . Transportation needs:    Medical: Not on file    Non-medical: Not on file  Tobacco Use  . Smoking status: Former Smoker    Types: Cigarettes    Last attempt to quit: 09/20/1972    Years since quitting: 45.9  . Smokeless tobacco: Never Used  . Tobacco comment: Quit at age 31  Substance and Sexual Activity  . Alcohol use: No  . Drug use: No  . Sexual activity: Not Currently  Lifestyle  . Physical activity:    Days per week: Not on file    Minutes per session: Not on file  . Stress: Not on file  Relationships  . Social  connections:    Talks on phone: Not on file    Gets together: Not on file    Attends religious service: Not on file    Active member of club or organization: Not on file    Attends meetings of clubs or organizations: Not on file    Relationship status: Not on file  . Intimate partner violence:    Fear of current or ex partner: Not on file    Emotionally abused: Not on file    Physically abused: Not on file    Forced sexual activity: Not on file  Other Topics Concern  . Not on file  Social History Narrative   Married   Does not get regular exercise    Family History  Problem Relation Age of Onset  . Heart failure Mother   . Ulcers Mother        Venous ulcers  . Heart failure Father   . Heart attack Brother        3 heart attacks    No Known Allergies   Review of Systems   Review of Systems: Negative Unless Checked Constitutional: [] Weight loss  [] Fever  [] Chills Cardiac: [] Chest pain   []  Atrial Fibrillation  [] Palpitations   [] Shortness of breath when laying flat   [] Shortness of breath with exertion. Vascular:  [] Pain in legs with walking   [] Pain in legs with standing  [] History of DVT   [] Phlebitis   [] Swelling in legs   [] Varicose veins   [] Non-healing ulcers Pulmonary:   [] Uses home oxygen   [] Productive cough   [] Hemoptysis   [] Wheeze  [] COPD   [] Asthma Neurologic:  [] Dizziness   [] Seizures   [] History of stroke   [] History of TIA  [] Aphasia   [] Vissual changes   [] Weakness or numbness in arm   [x] Weakness or numbness in leg Musculoskeletal:   [] Joint swelling   [] Joint pain   [] Low back pain  []  History of Knee Replacement Hematologic:  [] Easy bruising  [] Easy bleeding   [] Hypercoagulable state   [x] Anemic Gastrointestinal:  [] Diarrhea   [] Vomiting  [] Gastroesophageal reflux/heartburn   [] Difficulty swallowing. Genitourinary:  [x] Chronic kidney disease   [] Difficult urination  [] Anuric   [] Blood in urine Skin:  [] Rashes   [] Ulcers  Psychological:  [] History of  anxiety   []  History of major depression  []  Memory Difficulties     Objective:   Physical Exam  BP (!) 121/57 (BP Location: Right Arm)   Pulse (!) 55   Ht 6' (1.829 m)   Wt 158 lb (71.7 kg)   BMI 21.43 kg/m   Gen: WD/WN, NAD Head: [place]/AT, No temporalis wasting.  Ear/Nose/Throat: Hearing grossly intact, nares w/o erythema or drainage Eyes: PER, EOMI, sclera nonicteric.  Neck:  Supple, no masses.  No JVD.  Pulmonary:  Good air movement, no use of accessory muscles.  Cardiac: RRR Vascular: good thrill and bruit Vessel Right Left  Radial Palpable Palpable   Gastrointestinal: soft, non-distended. No guarding/no peritoneal signs.  Musculoskeletal: M/S 5/5 throughout.  No deformity or atrophy.  Neurologic: Pain and light touch intact in extremities.  Symmetrical.  Speech is fluent. Motor exam as listed above. Psychiatric: Judgment intact, Mood & affect appropriate for pt's clinical situation. Dermatologic: No Venous rashes. No Ulcers Noted.  No changes consistent with cellulitis. Lymph : No Cervical lymphadenopathy, no lichenification or skin changes of chronic lymphedema.      Assessment & Plan:   1. ESRD (end stage renal disease) (HCC) Recommend:  The patient is doing well and currently has adequate dialysis access. The patient's dialysis center is not reporting any access issues. Flow pattern is stable when compared to the prior ultrasound.  The patient should have a duplex ultrasound of the dialysis access in 6 months. The patient will follow-up with me in the office after each ultrasound    - VAS US DUPLEX DIALYSIS ACCESS (AVF, AVG); Future  2. Hypertension, unspecified type Continue antihypertensive medications as already ordered, these medications have been reviewed and there are no changes at this time.   3. Hyperlipidemia, unspecified hyperlipidemia type Continue statin as ordered and reviewed, no changes at this time    Current Outpatient Medications on File  Prior to Visit  Medication Sig Dispense Refill  . acetaminophen (TYLENOL) 650 MG CR tablet Take 650 mg by mouth 2 (two) times daily.     Marland Kitchen albuterol (PROVENTIL HFA;VENTOLIN HFA) 108 (90 Base) MCG/ACT inhaler Inhale 2 puffs into the lungs every 4 (four) hours as needed for wheezing or shortness of breath.    Marland Kitchen albuterol (PROVENTIL) (2.5 MG/3ML) 0.083% nebulizer solution Take 2.5 mg by nebulization every 6 (six) hours as needed for wheezing or shortness of breath.    Marland Kitchen amLODipine (NORVASC) 10 MG tablet Take 10 mg by mouth daily.     . carvedilol (COREG) 25 MG tablet Take 25 mg by mouth 2 (two) times daily with a meal.    . chlorpheniramine-HYDROcodone (TUSSIONEX) 10-8 MG/5ML SUER Take 5 mLs by mouth every 12 (twelve) hours as needed for cough. 140 mL 0  . docusate sodium (COLACE) 100 MG capsule Take 100 mg by mouth daily as needed for mild constipation.    . fluticasone (FLONASE) 50 MCG/ACT nasal spray Place 2 sprays into both nostrils daily as needed for allergies or rhinitis.    Marland Kitchen guaiFENesin (MUCINEX) 600 MG 12 hr tablet Take 600 mg by mouth 2 (two) times daily as needed for cough.    . liver oil-zinc oxide (DESITIN) 40 % ointment Apply 1 application topically 2 (two) times daily. And as needed with toileting for skin protection    . pantoprazole (PROTONIX) 40 MG tablet Take 40 mg by mouth daily.    Marland Kitchen senna-docusate (SENOKOT-S) 8.6-50 MG tablet Take 1 tablet by mouth at bedtime.    Marland Kitchen aspirin EC 325 MG EC tablet Take 1 tablet (325 mg total) by mouth daily with breakfast. (Patient not taking: Reported on 08/25/2018) 30 tablet 0  . buPROPion (WELLBUTRIN XL) 150 MG 24 hr tablet Take 150 mg by mouth daily.    . calcitRIOL (ROCALTROL) 0.25 MCG capsule Take 0.25 mcg by mouth 3 (three) times a week. On dialysis days (MON, WED, FRI)    . citalopram (CELEXA) 20 MG tablet Take  20 mg by mouth daily.     Marland Kitchen escitalopram (LEXAPRO) 10 MG tablet Take by mouth.    . fish oil-omega-3 fatty acids 1000 MG capsule Take  2 capsules by mouth daily with supper.     Marland Kitchen HYDROcodone-acetaminophen (NORCO) 5-325 MG tablet Take 1 tablet by mouth 2 (two) times daily. (Patient not taking: Reported on 08/25/2018) 30 tablet 0  . lidocaine (LIDODERM) 5 % Place 2 patches onto the skin daily. 1 patch to left lower back and 1 patch to right lower back. Remove after 12 hours.    Marland Kitchen lisinopril (PRINIVIL,ZESTRIL) 10 MG tablet Take 10 mg by mouth daily.    . naloxone (NARCAN) nasal spray 4 mg/0.1 mL 1 spray. One spray in either nostril once for known/suspected opioid overdose. May repeat every 2-3 minutes in alternating nostril til EMS arrives    . Nutritional Supplements (FEEDING SUPPLEMENT, NEPRO CARB STEADY,) LIQD Take by mouth.    . ondansetron (ZOFRAN ODT) 4 MG disintegrating tablet Take 1 tablet (4 mg total) by mouth every 8 (eight) hours as needed for nausea or vomiting. (Patient not taking: Reported on 04/08/2018) 20 tablet 0  . polycarbophil (FIBERCON) 625 MG tablet Take 625 mg by mouth 2 (two) times daily.    . ranitidine (ZANTAC) 150 MG tablet Take 150 mg by mouth 2 (two) times daily.    . rosuvastatin (CRESTOR) 10 MG tablet Take 10 mg by mouth daily.    . sevelamer carbonate (RENVELA) 800 MG tablet Take 800 mg by mouth 3 (three) times daily with meals.    . tamsulosin (FLOMAX) 0.4 MG CAPS capsule Take 0.4 mg by mouth daily.    Marland Kitchen thiamine 100 MG tablet Take by mouth.     No current facility-administered medications on file prior to visit.     There are no Patient Instructions on file for this visit. Return in about 6 months (around 02/24/2019).   Georgiana Spinner, NP  This note was completed with Office manager.  Any errors are purely unintentional.

## 2018-11-19 DEATH — deceased

## 2019-02-26 ENCOUNTER — Ambulatory Visit (INDEPENDENT_AMBULATORY_CARE_PROVIDER_SITE_OTHER): Payer: Medicare (Managed Care) | Admitting: Nurse Practitioner

## 2019-02-26 ENCOUNTER — Encounter (INDEPENDENT_AMBULATORY_CARE_PROVIDER_SITE_OTHER): Payer: Medicare (Managed Care)

## 2020-01-11 IMAGING — CT CT HEAD W/O CM
4 series · 16 of 47 positions shown, 18 images · non-contrast
Comparison: 04/01/2013

CLINICAL DATA: Head trauma, ataxia

EXAM:
CT HEAD WITHOUT CONTRAST
TECHNIQUE: Contiguous axial images were obtained from the base of the skull
through the vertex without intravenous contrast.

[Series 2: head wo · axial · 0.41mm/px · z∈[-47,+53]mm · 7 of 28 slices shown, 9 images]
[im 4/28  brain]
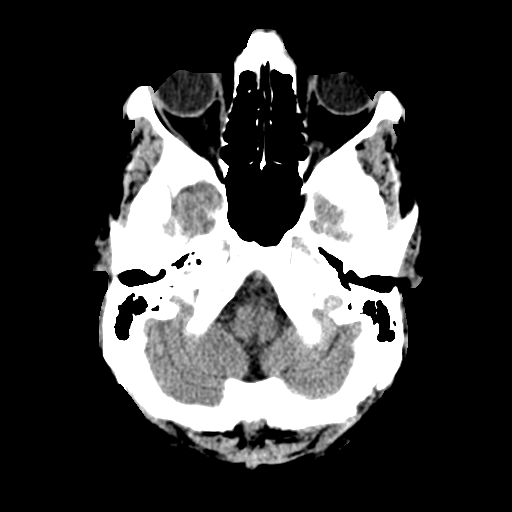
[im 4/28  bone]
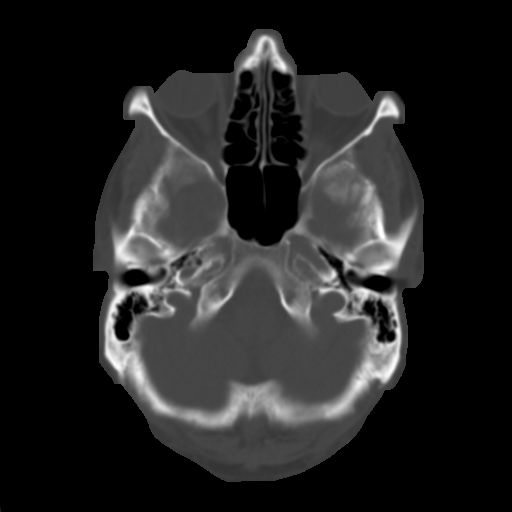
[im 7/28  brain]
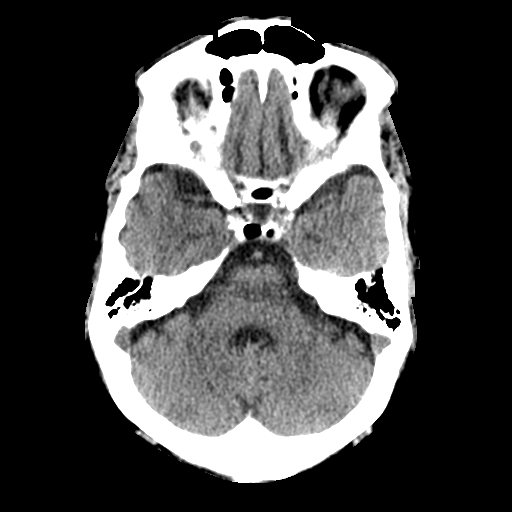
[im 11/28  brain]
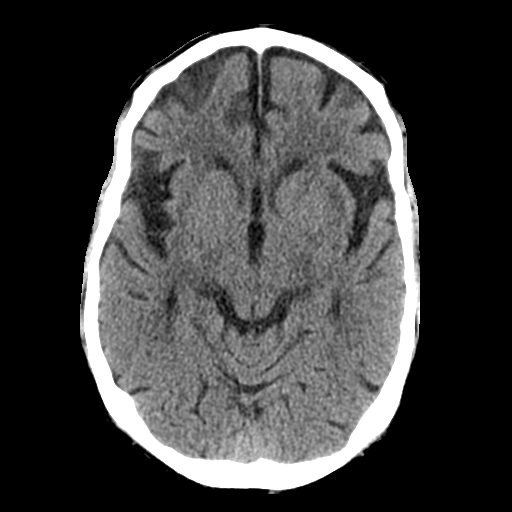
[im 14/28  brain]
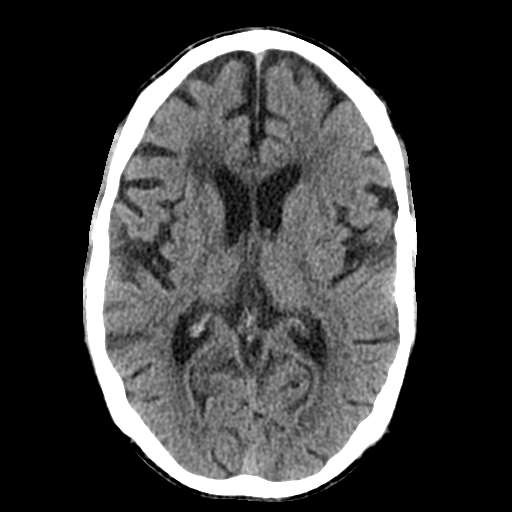
[im 17/28  brain]
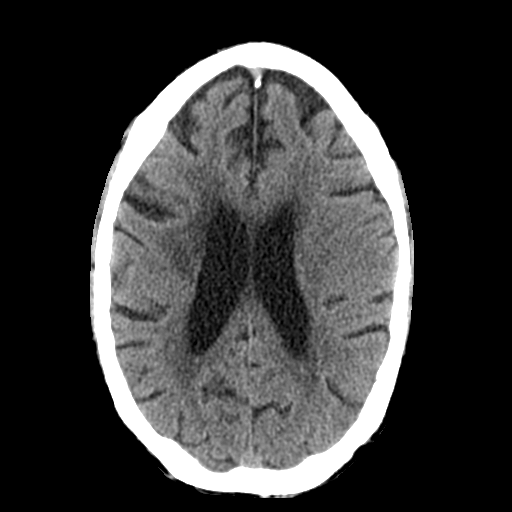
[im 17/28  bone]
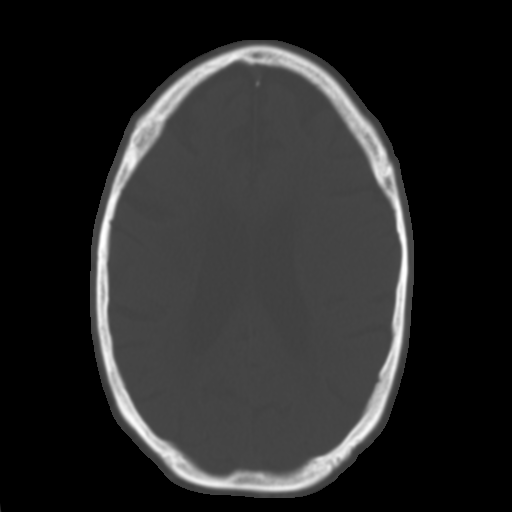
[im 21/28  brain]
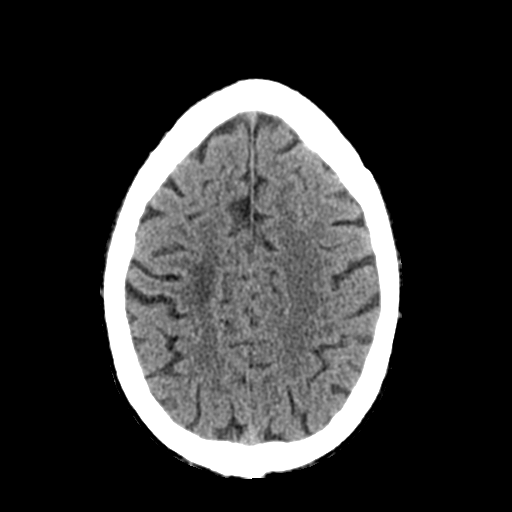
[im 24/28  brain]
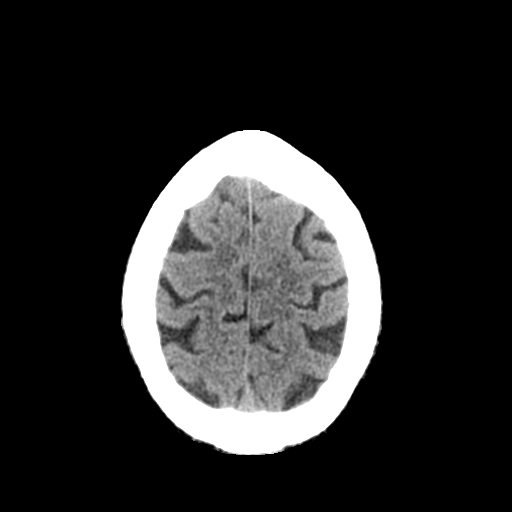

[Series 3: head bone · axial · 0.41mm/px · z∈[-50,-22]mm · 3 of 70 slices shown]
[im 7/70  bone]
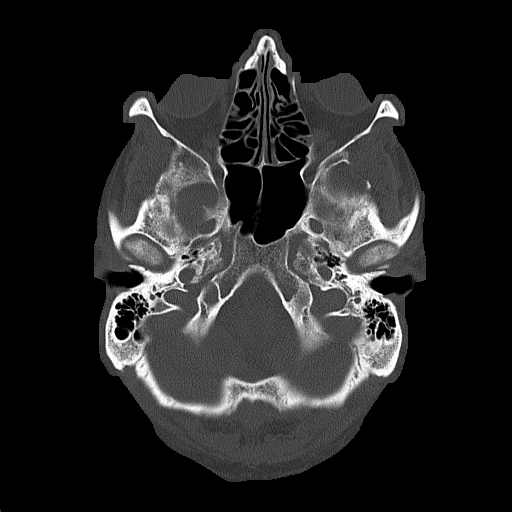
[im 14/70  bone]
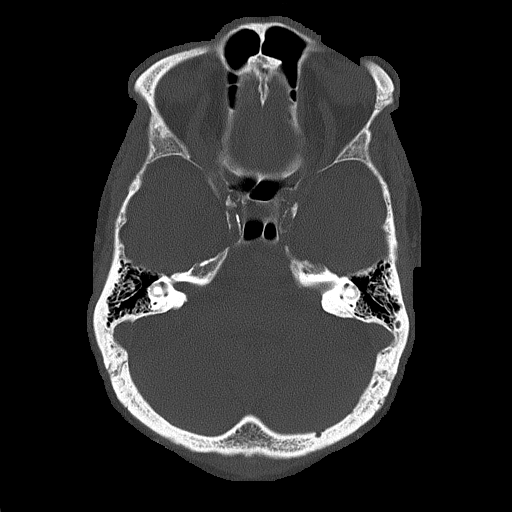
[im 21/70  bone]
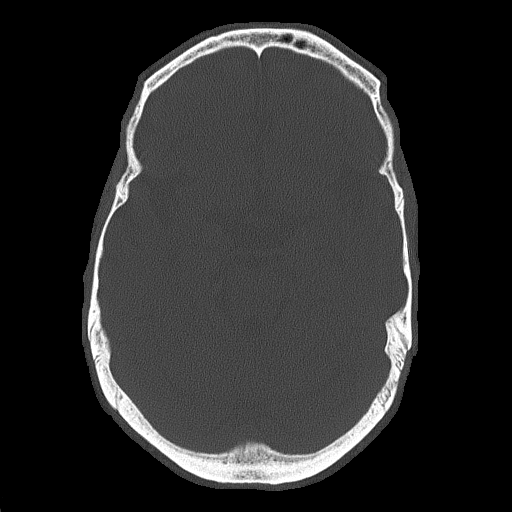

[Series 4: coronal soft tissue · coronal · 0.29mm/px · 3 of 67 slices shown]
[im 23/67  brain]
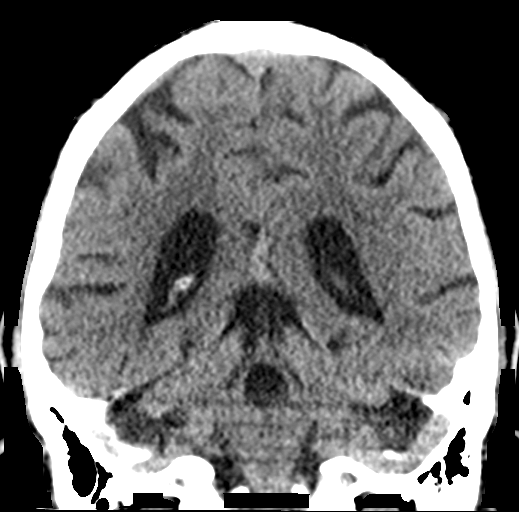
[im 30/67  brain]
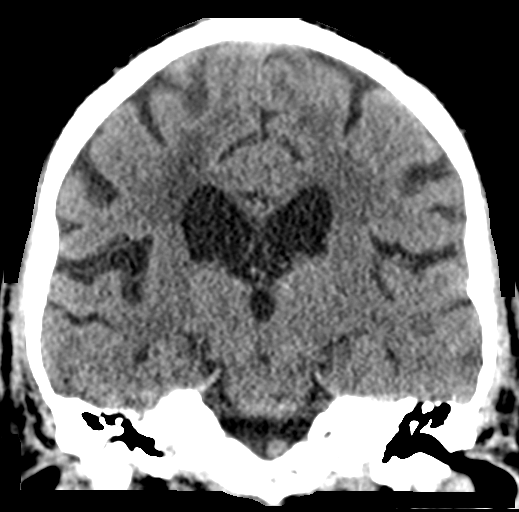
[im 37/67  brain]
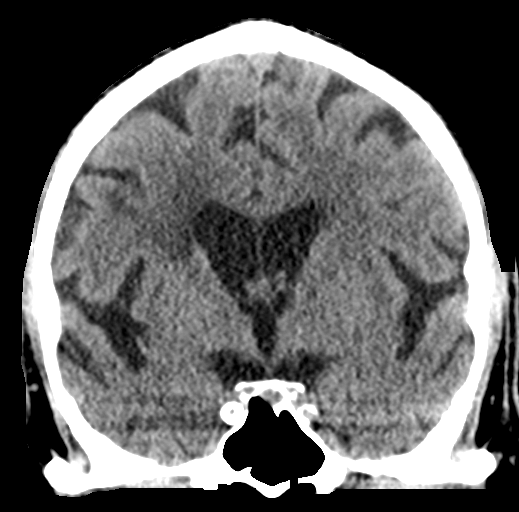

[Series 5: sagittal soft tissue · sagittal · 0.29mm/px · 3 of 50 slices shown]
[im 17/50  brain]
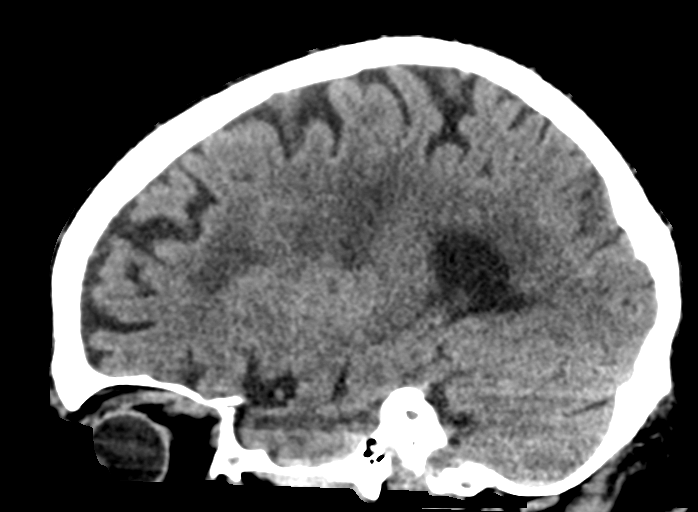
[im 25/50  brain]
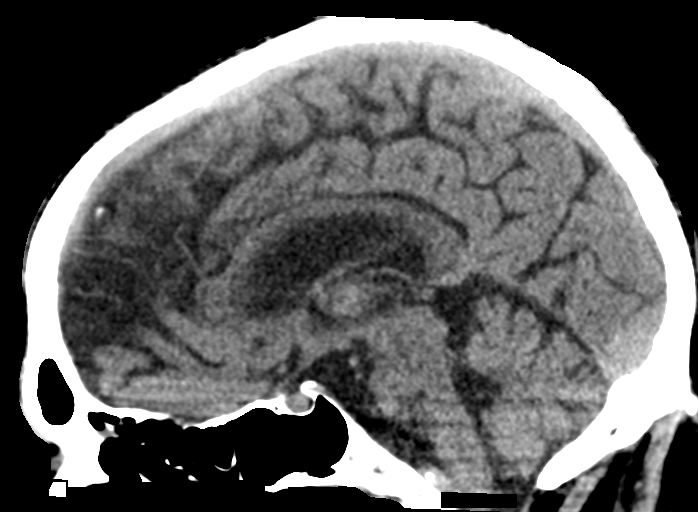
[im 33/50  brain]
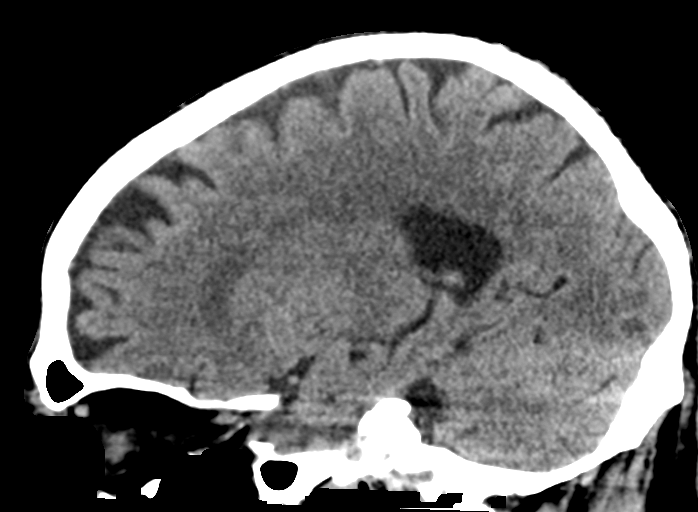

[16 of 47 positions shown; findings below may reference images not displayed]

FINDINGS: Brain: No acute intracranial hemorrhage. No focal mass lesion. No CT
evidence of acute infarction. No midline shift or mass effect. No
hydrocephalus. Basilar cisterns are patent.

There are periventricular and subcortical white matter
hypodensities. Generalized cortical atrophy.

Vascular: No hyperdense vessel or unexpected calcification.

Skull: Normal. Negative for fracture or focal lesion.

Sinuses/Orbits: Paranasal sinuses and mastoid air cells are clear.
Orbits are clear.

Other: None.
IMPRESSION: No acute intracranial findings

Atrophy and white matter microvascular disease.

## 2020-01-11 IMAGING — CR DG CERVICAL SPINE COMPLETE 4+V
1 series · 7 of 7 positions shown · non-contrast
Comparison: None.

CLINICAL DATA: Pain after fall.

EXAM:
CERVICAL SPINE - COMPLETE 4+ VIEW

[Series 1: dg cervical spine complete · 0.14mm/px · 7 of 7 slices shown]
[im 1/7]
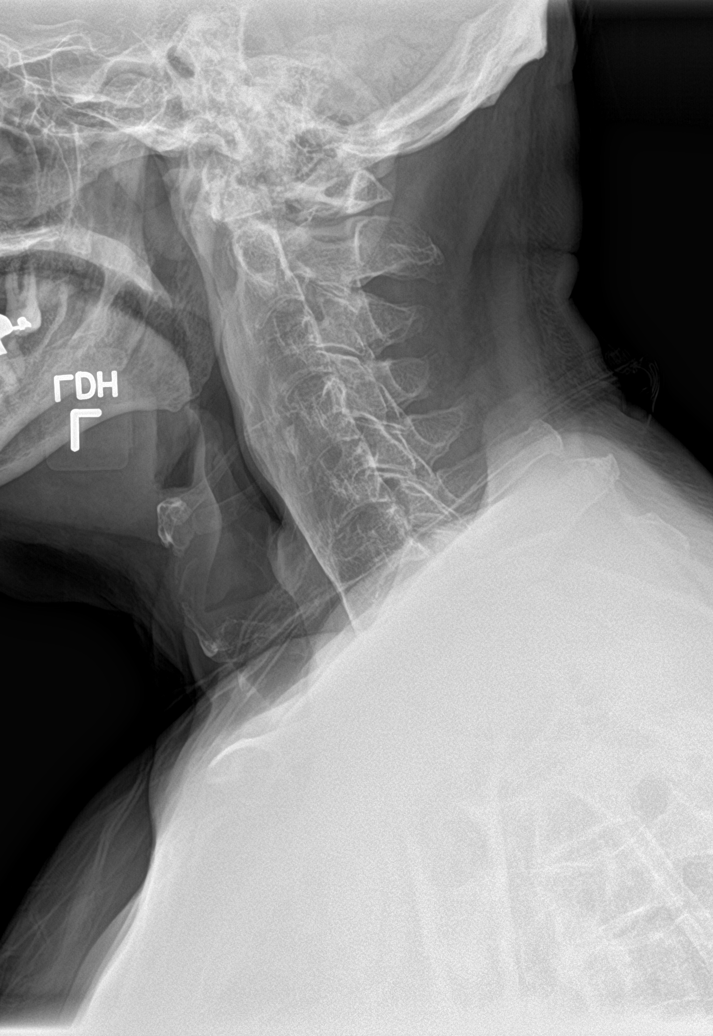
[im 2/7]
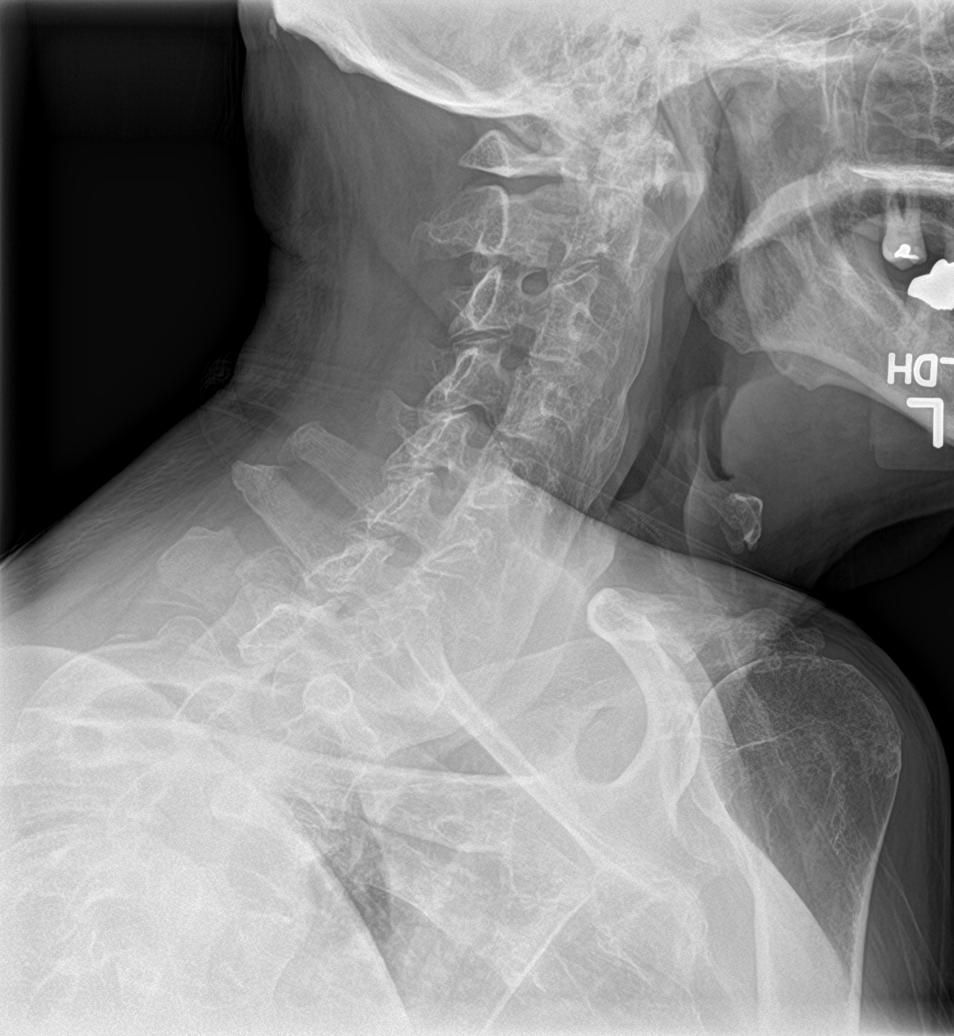
[im 3/7]
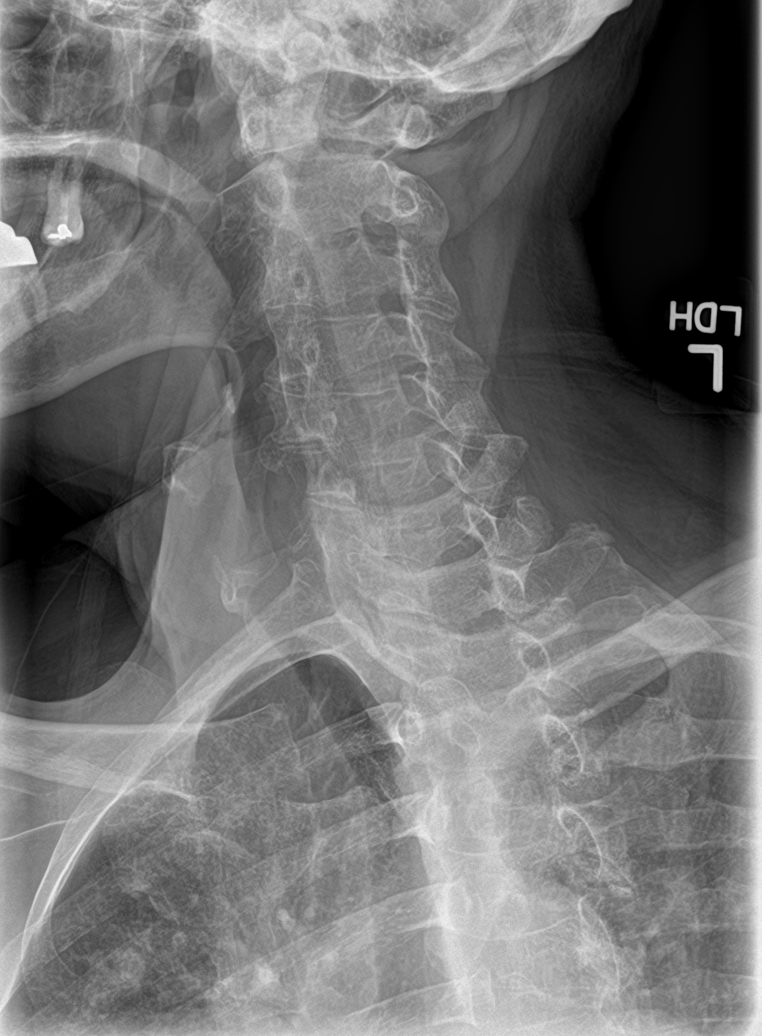
[im 4/7]
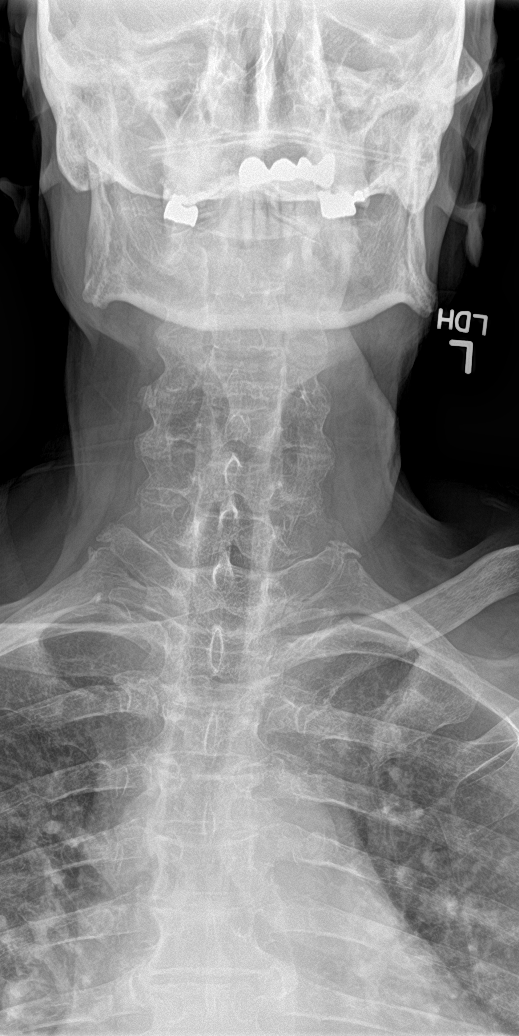
[im 5/7]
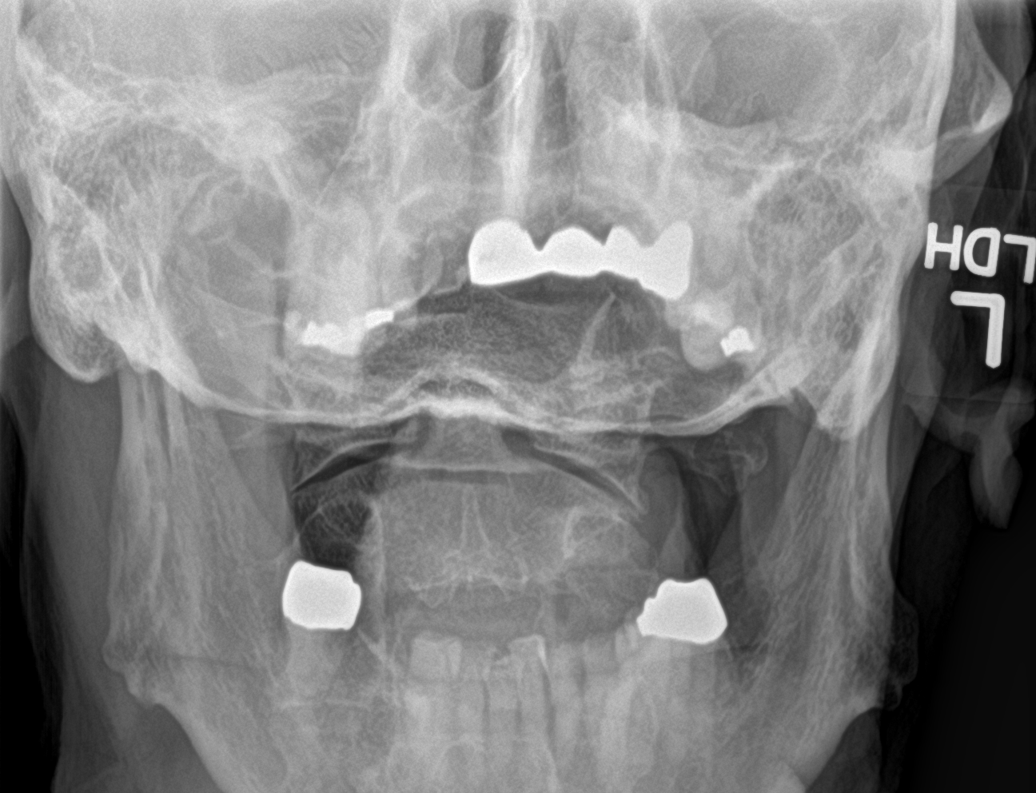
[im 6/7]
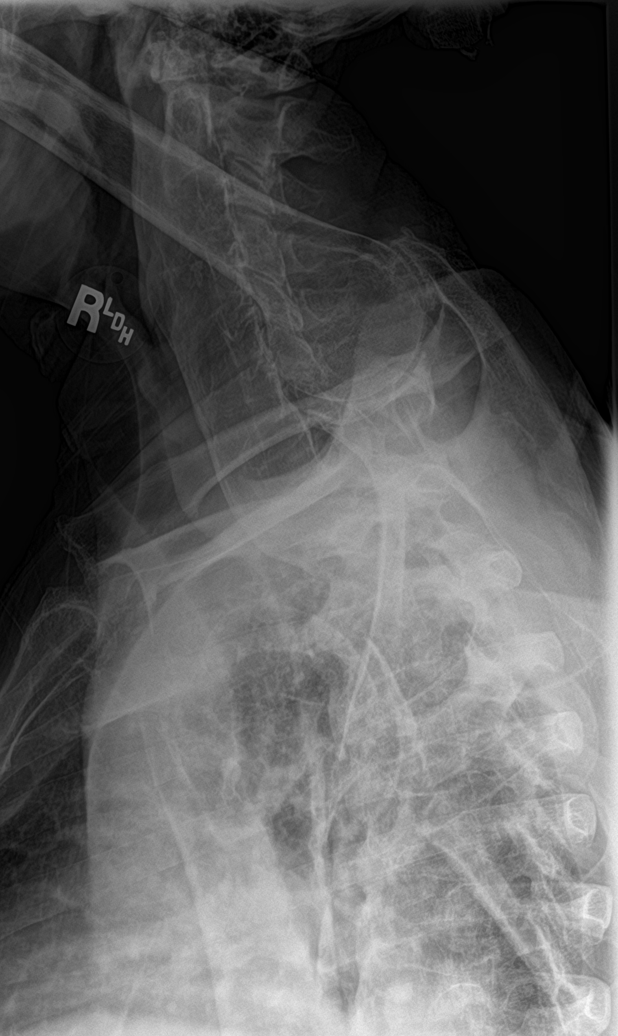
[im 7/7]
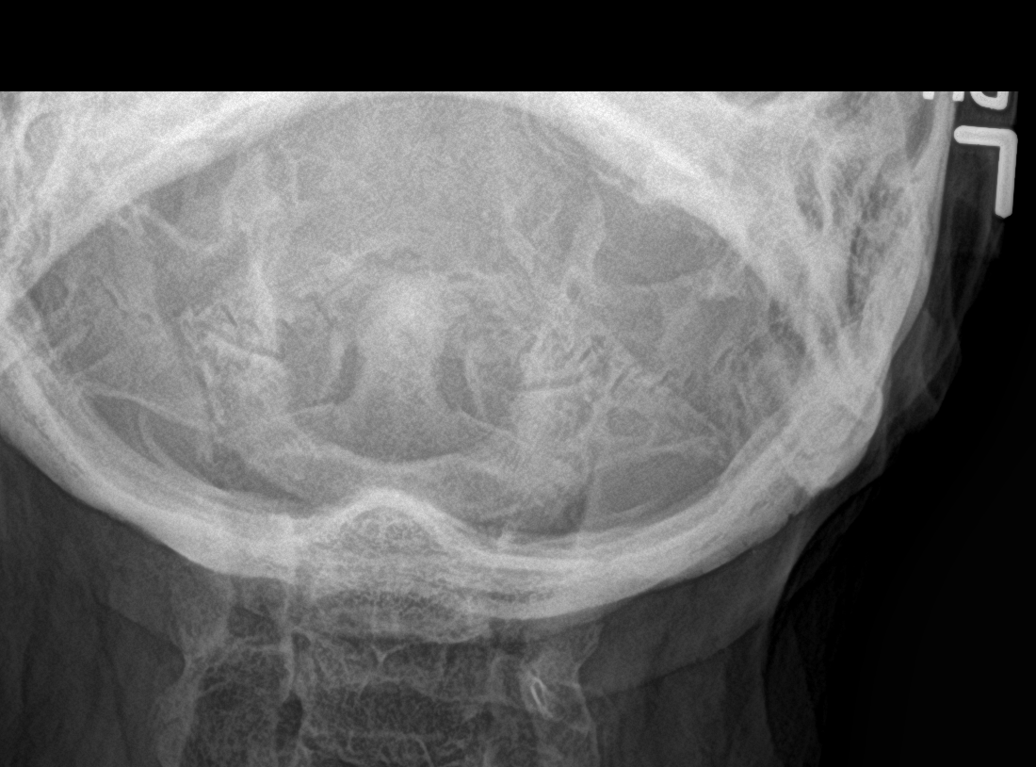

[7 of 7 positions shown; findings below may reference images not displayed]

FINDINGS: Flowing anterior osteophytes are identified at all visualized
levels. The cervical spine is only well seen through C6 on the
initial lateral view into the mid C7 level on the swimmer's view.
The pre odontoid space is normal. Prevertebral soft tissues anterior
to the flowing osteophytes are normal. No fractures are identified.
The neural foramina are patent within visualized limits. The lateral
masses of C1 align with C2. Visualized portions of the odontoid
process are normal.
IMPRESSION: 1. No evidence of fracture or traumatic malalignment.
2. Diffuse flowing anterior osteophytes.

## 2020-03-15 IMAGING — CR DG ABDOMEN 1V
2 series · 2 of 2 positions shown · non-contrast
Comparison: January 17, 2014

CLINICAL DATA: Patient on dialysis.  Left-sided kidney stone.

EXAM:
ABDOMEN - 1 VIEW

[abdomen kub (1 of 2)]
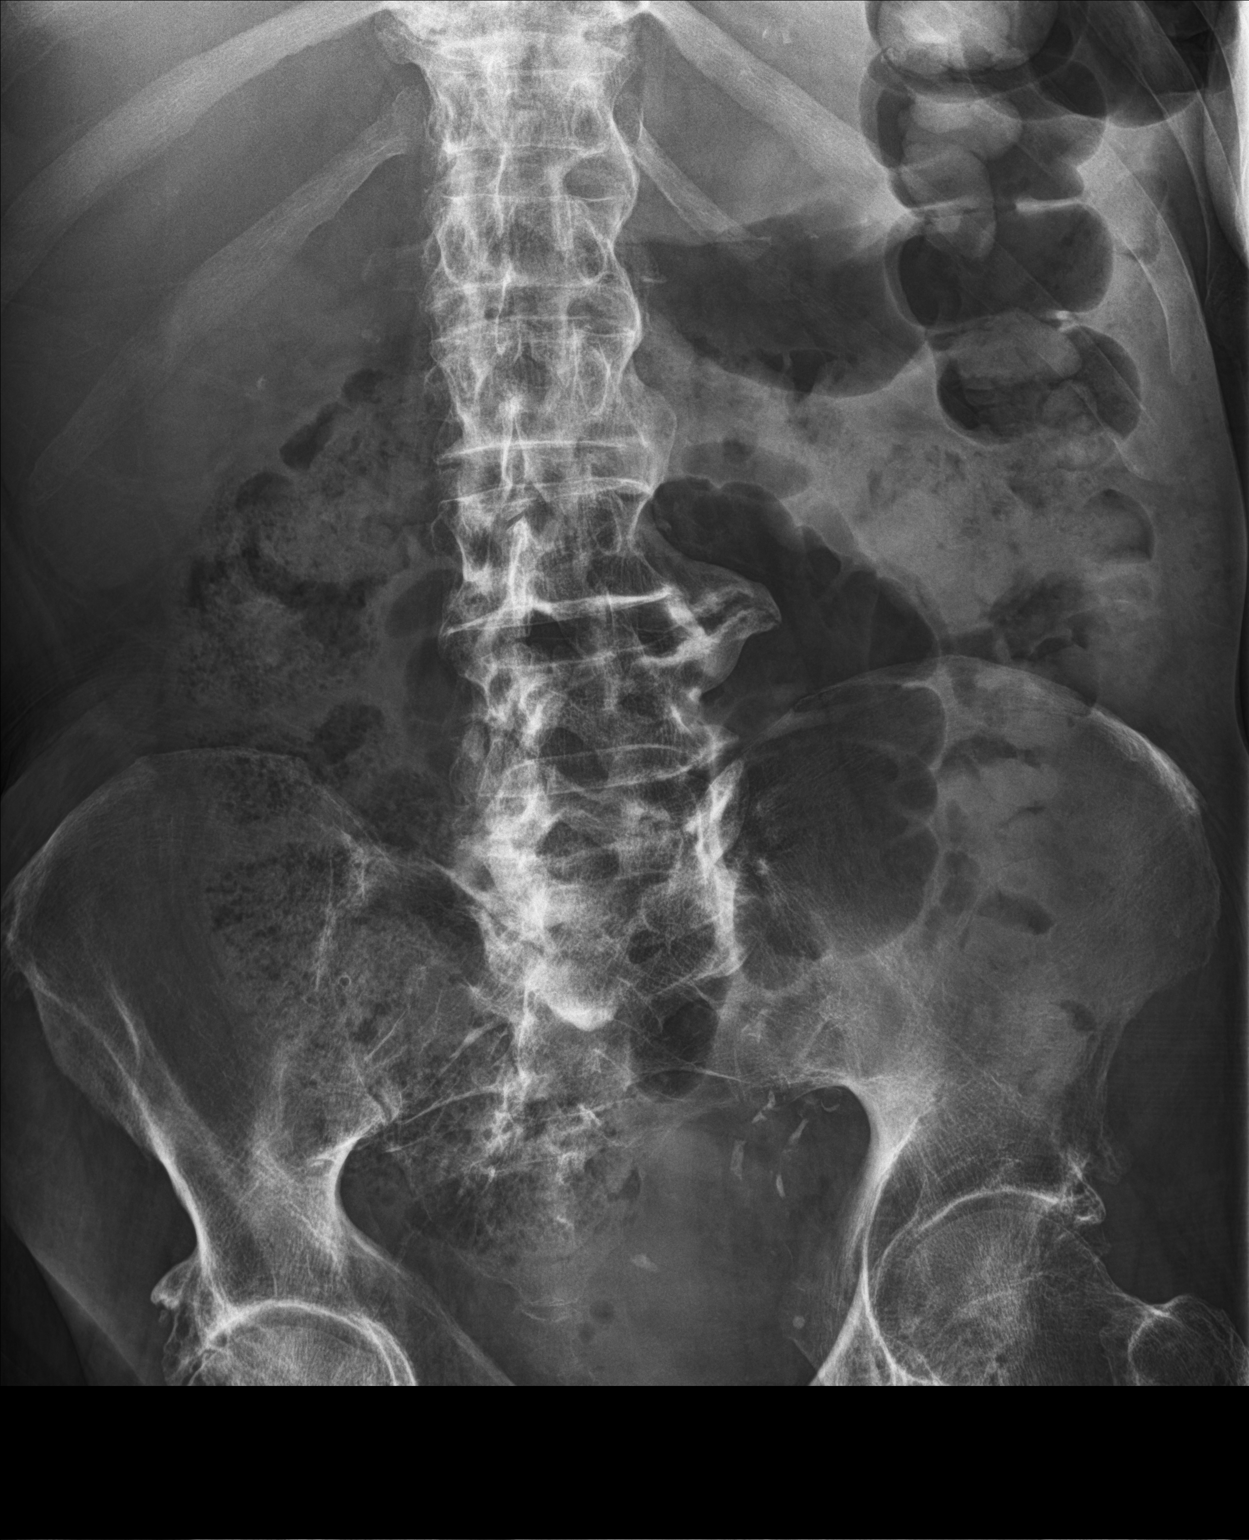

[abdomen kub (2 of 2)]
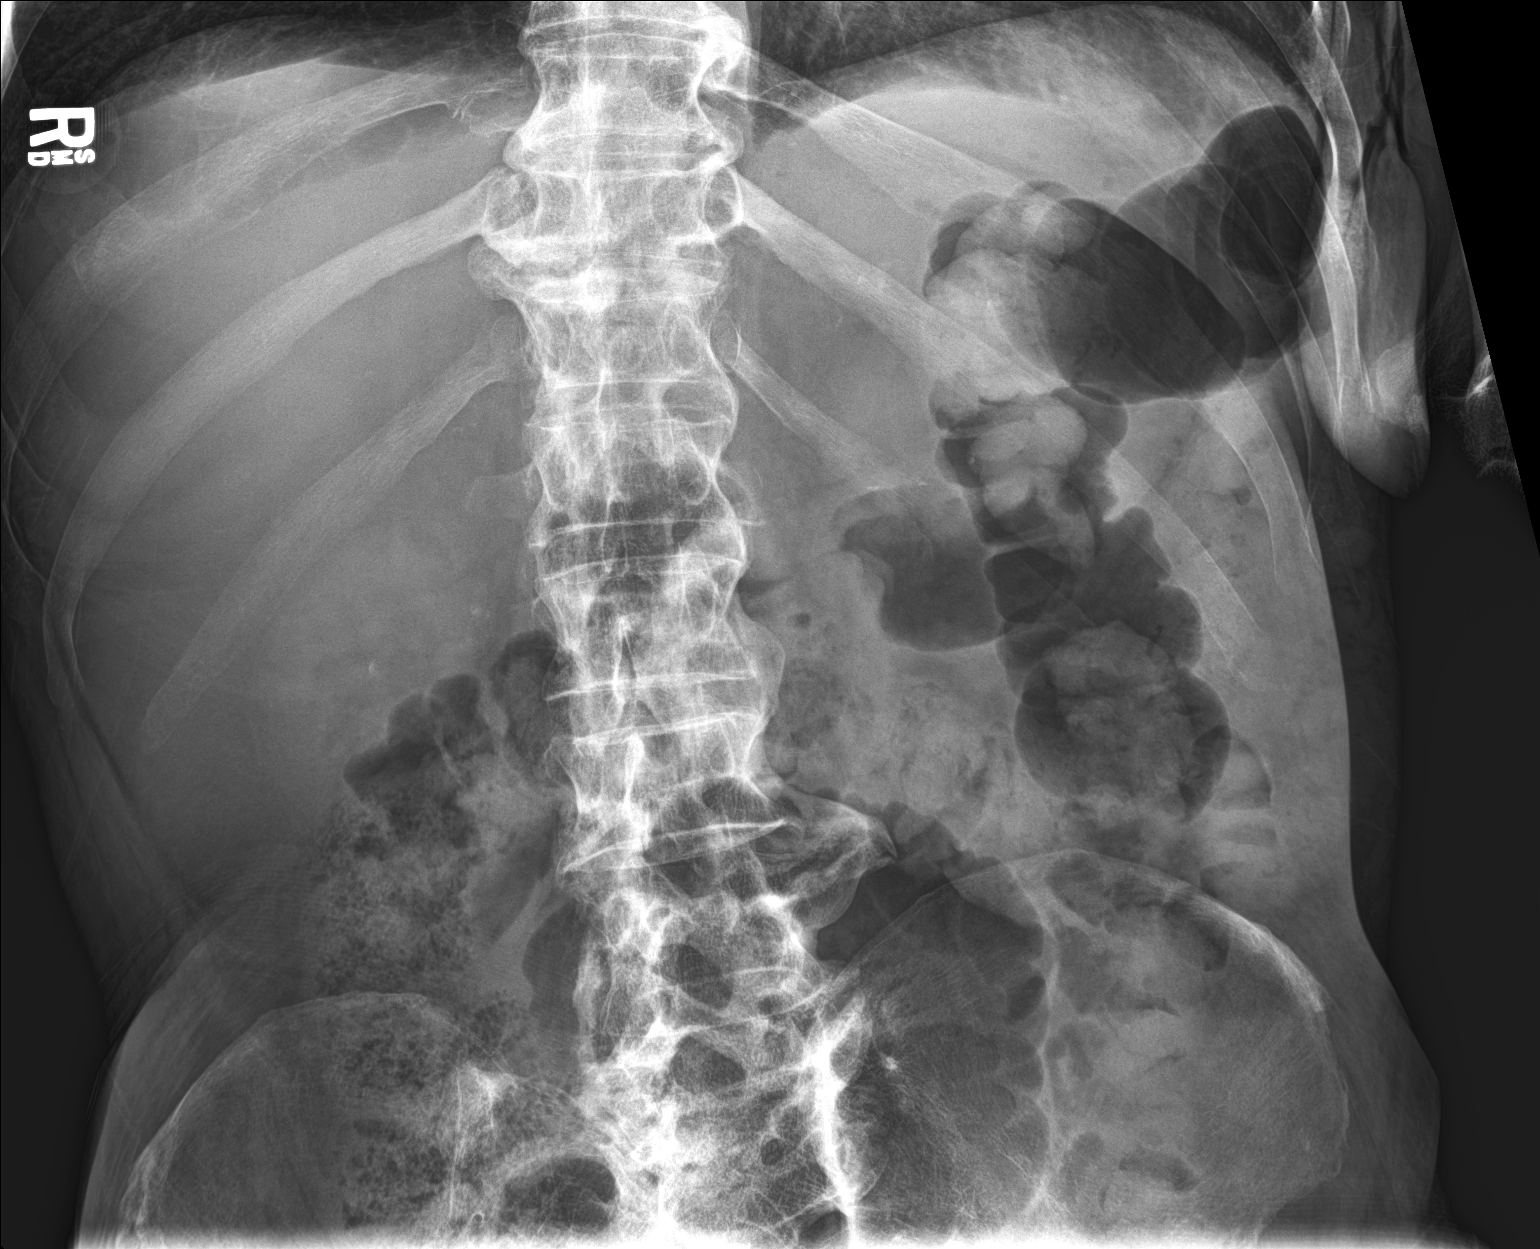

[2 of 2 positions shown; findings below may reference images not displayed]

FINDINGS: Small stones projected over the right kidney. The left kidney is
obscured by bowel contents. There is a calcification in the lateral
left pelvis which is likely a phlebolith. There is an oval
calcification just to the left of the sacrum which is similar in
shape to the previously identified lower pole left renal stone seen
in 4058. Vascular calcifications noted. Degenerative changes in the
spine. Fecal loading in the colon. No other acute abnormalities.
IMPRESSION: 1. There is an oval calcification in the left side of the pelvis
which is similar in size and shape to the stone seen in the lower
left kidney in 4058. A distal left ureteral stone is not excluded.
Recommend clinical correlation.
2. Stones in the right kidney.
3. The left kidney is obscured by bowel contents.
4. Moderate to severe fecal loading throughout the colon.
